# Patient Record
Sex: Male | Born: 1985 | Race: White | Hispanic: No | Marital: Married | Smoking: Never smoker
Health system: Southern US, Community
[De-identification: ages and names within clinical notes are randomized; demographics above are authoritative.]

## PROBLEM LIST (undated history)

## (undated) DIAGNOSIS — L509 Urticaria, unspecified: Secondary | ICD-10-CM

## (undated) DIAGNOSIS — B2790 Infectious mononucleosis, unspecified without complication: Secondary | ICD-10-CM

## (undated) DIAGNOSIS — R0600 Dyspnea, unspecified: Secondary | ICD-10-CM

## (undated) DIAGNOSIS — R5382 Chronic fatigue, unspecified: Secondary | ICD-10-CM

## (undated) DIAGNOSIS — K5790 Diverticulosis of intestine, part unspecified, without perforation or abscess without bleeding: Secondary | ICD-10-CM

## (undated) DIAGNOSIS — T783XXA Angioneurotic edema, initial encounter: Secondary | ICD-10-CM

## (undated) DIAGNOSIS — E669 Obesity, unspecified: Secondary | ICD-10-CM

## (undated) DIAGNOSIS — E66811 Obesity, class 1: Secondary | ICD-10-CM

## (undated) DIAGNOSIS — E7800 Pure hypercholesterolemia, unspecified: Secondary | ICD-10-CM

## (undated) DIAGNOSIS — L309 Dermatitis, unspecified: Secondary | ICD-10-CM

## (undated) DIAGNOSIS — K219 Gastro-esophageal reflux disease without esophagitis: Secondary | ICD-10-CM

## (undated) DIAGNOSIS — R000 Tachycardia, unspecified: Secondary | ICD-10-CM

## (undated) DIAGNOSIS — L649 Androgenic alopecia, unspecified: Secondary | ICD-10-CM

## (undated) DIAGNOSIS — E291 Testicular hypofunction: Secondary | ICD-10-CM

## (undated) DIAGNOSIS — R0609 Other forms of dyspnea: Secondary | ICD-10-CM

## (undated) DIAGNOSIS — K760 Fatty (change of) liver, not elsewhere classified: Secondary | ICD-10-CM

## (undated) HISTORY — DX: Infectious mononucleosis, unspecified without complication: B27.90

## (undated) HISTORY — DX: Pure hypercholesterolemia, unspecified: E78.00

## (undated) HISTORY — DX: Diverticulosis of intestine, part unspecified, without perforation or abscess without bleeding: K57.90

## (undated) HISTORY — DX: Dyspnea, unspecified: R06.00

## (undated) HISTORY — DX: Other forms of dyspnea: R06.09

## (undated) HISTORY — DX: Tachycardia, unspecified: R00.0

## (undated) HISTORY — DX: Gastro-esophageal reflux disease without esophagitis: K21.9

## (undated) HISTORY — DX: Chronic fatigue, unspecified: R53.82

## (undated) HISTORY — DX: Androgenic alopecia, unspecified: L64.9

## (undated) HISTORY — DX: Testicular hypofunction: E29.1

## (undated) HISTORY — DX: Fatty (change of) liver, not elsewhere classified: K76.0

## (undated) HISTORY — DX: Dermatitis, unspecified: L30.9

## (undated) HISTORY — DX: Urticaria, unspecified: L50.9

## (undated) HISTORY — DX: Angioneurotic edema, initial encounter: T78.3XXA

## (undated) HISTORY — DX: Obesity, unspecified: E66.9

## (undated) HISTORY — DX: Obesity, class 1: E66.811

---

## 2011-12-07 HISTORY — PX: WISDOM TOOTH EXTRACTION: SHX21

## 2017-04-04 DIAGNOSIS — R300 Dysuria: Secondary | ICD-10-CM | POA: Diagnosis not present

## 2017-04-04 DIAGNOSIS — Z7251 High risk heterosexual behavior: Secondary | ICD-10-CM | POA: Diagnosis not present

## 2017-04-04 DIAGNOSIS — Z209 Contact with and (suspected) exposure to unspecified communicable disease: Secondary | ICD-10-CM | POA: Diagnosis not present

## 2017-05-23 DIAGNOSIS — M6281 Muscle weakness (generalized): Secondary | ICD-10-CM | POA: Diagnosis not present

## 2017-05-23 DIAGNOSIS — R5383 Other fatigue: Secondary | ICD-10-CM | POA: Diagnosis not present

## 2017-06-01 DIAGNOSIS — B354 Tinea corporis: Secondary | ICD-10-CM | POA: Diagnosis not present

## 2017-06-01 DIAGNOSIS — B359 Dermatophytosis, unspecified: Secondary | ICD-10-CM | POA: Diagnosis not present

## 2017-06-01 DIAGNOSIS — L239 Allergic contact dermatitis, unspecified cause: Secondary | ICD-10-CM | POA: Diagnosis not present

## 2017-06-15 DIAGNOSIS — L42 Pityriasis rosea: Secondary | ICD-10-CM | POA: Diagnosis not present

## 2017-07-15 DIAGNOSIS — R0609 Other forms of dyspnea: Secondary | ICD-10-CM | POA: Diagnosis not present

## 2017-07-15 DIAGNOSIS — B369 Superficial mycosis, unspecified: Secondary | ICD-10-CM | POA: Diagnosis not present

## 2017-07-15 DIAGNOSIS — R05 Cough: Secondary | ICD-10-CM | POA: Diagnosis not present

## 2017-07-15 DIAGNOSIS — R000 Tachycardia, unspecified: Secondary | ICD-10-CM | POA: Diagnosis not present

## 2017-08-16 DIAGNOSIS — R000 Tachycardia, unspecified: Secondary | ICD-10-CM | POA: Diagnosis not present

## 2017-12-09 DIAGNOSIS — J069 Acute upper respiratory infection, unspecified: Secondary | ICD-10-CM | POA: Diagnosis not present

## 2017-12-09 DIAGNOSIS — L309 Dermatitis, unspecified: Secondary | ICD-10-CM | POA: Diagnosis not present

## 2018-08-17 ENCOUNTER — Encounter: Payer: Self-pay | Admitting: Allergy & Immunology

## 2018-08-17 ENCOUNTER — Telehealth: Payer: Self-pay

## 2018-08-17 ENCOUNTER — Ambulatory Visit (INDEPENDENT_AMBULATORY_CARE_PROVIDER_SITE_OTHER): Payer: BLUE CROSS/BLUE SHIELD | Admitting: Allergy & Immunology

## 2018-08-17 VITALS — BP 120/80 | HR 83 | Temp 98.2°F | Resp 18 | Ht 70.0 in | Wt 231.8 lb

## 2018-08-17 DIAGNOSIS — R000 Tachycardia, unspecified: Secondary | ICD-10-CM

## 2018-08-17 DIAGNOSIS — R0602 Shortness of breath: Secondary | ICD-10-CM

## 2018-08-17 DIAGNOSIS — T7840XD Allergy, unspecified, subsequent encounter: Secondary | ICD-10-CM | POA: Diagnosis not present

## 2018-08-17 MED ORDER — MONTELUKAST SODIUM 10 MG PO TABS: 10.0000 mg | ORAL_TABLET | Freq: Every day | ORAL | 5 refills | Status: DC

## 2018-08-17 NOTE — Patient Instructions (Addendum)
1. Tachycardia - We will refer you Cardiology for evaluation of your tachycardia. - They should call you next week to schedule this.  - Try to find out who your previous cardiologist was.  2. Allergic reaction - We will get some blood to work to rule out mast cell activity. - We are also going to get some blood work to rule out carcinoid syndrome as well as pheochromocytomas. - These are unlikely, but can present with tachycardia and rashes.  - We will also look at alpha-gal syndrome, which is a sensitivity to red meats. - We can do skin testing to the most common foods at the next visit (we cannot do testing on the skin today since you had a reaction so recently).  - In the interim, start a daily antihistamine daily (cetirizine is probably the cheapest and most effective) and Singulair (montelukast) 10mg  daily.   3. SOB (shortness of breath) - I do not want to do a large workup at this point for the shortness of breath. - Once you find out who you saw in [place], we will get their records and see what they did so that we do not repeat what has already been done. - Use the inhaler that we gave you today until the next visit to see if it helps with the shortness of breath: Breo one puff once daily - Breo contains a long acting albuterol to keep your lungs open combined with a long acting steroid to help with inflammation within the lungs.   4. Return in about 4 weeks (around 09/14/2018).   Please inform us of any Emergency Department visits, hospitalizations, or changes in symptoms. Call us before going to the ED for breathing or allergy symptoms since we might be able to fit you in for a sick visit. Feel free to contact us anytime with any questions, problems, or concerns.  It was a pleasure to meet you today!  Websites that have reliable patient information: 1. American Academy of Asthma, Allergy, and Immunology: www.aaaai.org 2. Food Allergy Research and Education (FARE):  foodallergy.org 3. Mothers of Asthmatics: http://www.asthmacommunitynetwork.org 4. American College of Allergy, Asthma, and Immunology: MissingWeapons.cawww.acaai.org   Make sure you are registered to vote! If you have moved or changed any of your contact information, you will need to get this updated before voting!

## 2018-08-17 NOTE — Progress Notes (Signed)
NEW PATIENT  Date of Service/Encounter:  08/17/18  Referring provider: Patient, No Pcp Per   Assessment:   Tachycardia  Allergic reaction  SOB (shortness of breath) - unclear whether this is asthma or not   Peter Blankenship is a delightful 32 y.o. male presenting for an evaluation of a constellation of symptoms, including flushing, shortness of breath, tachycardia, and gastrointestinal distress. All of these symptoms do not necessarily occur together, but these certainly bring up the possibility of pheochromocytoma or carcinoid syndrome. We will get some labs to rule these out. We will also get some labs to rule out alpha gal sensitivity, as this can present in rather bizarre ways. We will also get him referred to a local cardiologist so that this tachycardia can we worked up. We are going to empirically start a combined ICS/LABA to see if this would help with the shortness of breath.    Plan/Recommendations:   1. Tachycardia - We will refer you Cardiology for evaluation of your tachycardia. - They should call you next week to schedule this.  - Try to find out who your previous cardiologist was.  2. Allergic reaction - We will get some blood to work to rule out mast cell activity. - We are also going to get some blood work to rule out carcinoid syndrome as well as pheochromocytomas. - These are unlikely, but can present with tachycardia and rashes.  - We will also look at alpha-gal syndrome, which is a sensitivity to red meats. - We can do skin testing to the most common foods at the next visit (we cannot do testing on the skin today since you had a reaction so recently).  - In the interim, start a daily antihistamine daily (cetirizine is probably the cheapest and most effective) and Singulair (montelukast) 10mg  daily.   3. SOB (shortness of breath) - I do not want to do a large workup at this point for the shortness of breath. - Once you find out who you saw in [place], we will get  their records and see what they did so that we do not repeat what has already been done. - Use the inhaler that we gave you today until the next visit to see if it helps with the shortness of breath: Breo one puff once daily - Breo contains a long acting albuterol to keep your lungs open combined with a long acting steroid to help with inflammation within the lungs.   4. Return in about 4 weeks (around 09/14/2018).   Subjective:   Peter Blankenship is a 32 y.o. male presenting today for evaluation of  Chief Complaint  Patient presents with  . Establish Care  . Allergic Reaction  . Cough    3+ years duration  . Rash    Peter Blankenship has a history of the following: Patient Active Problem List   Diagnosis Date Noted  . Tachycardia 08/19/2018  . SOB (shortness of breath) 08/19/2018    History obtained from: chart review and patient.  Peter Blankenship was referred by Patient, No Pcp Per. He recently moved to [place] a few months ago.   Peter Blankenship is a 32 y.o. male presenting for an evaluation of a constellation of symptoms, including flushing, tachycardia, and shortness of breath.  The first episode occurred when he was around 32 years of age. He was hospitalized for mono for one week for a severe case. He was put on steroids and had full body rash to the point that his entire body  was red. This was the first time that he started having the breathing problems. He did have some problems with breathing at that point, but they were manageable. He did lose his sense of smell at that time nearly completely for five years. He denies concurrent congestion. He was on a nasal steroid that he thinks caused it.   Three years ago (December 2016), he was on a cruise and was red from head to toe. He is unsure of what new things he ate during that time. He went to see Urgent Care and was told that he had "red man syndrome". He was told to go to ED emergently but he declined due to cost. He treated with Benadryl with  improvement in his symptoms. He did have trouble breathing for one hour during one the days, but it was not severe. He does not think that he had heart rate issues. He was not tracking things in the same detail as he is now. He felt sick at the time and took amoxicillin (had in in [place] as a prophylaxis) and then had the reaction. He has since been worked up for penicillin allergies and this was all negative. He thinks that it was blood testing but the details were hazy.   He does have intermittent facial flushing, but otherwise the rash itself has been absent until this past Sunday. He does report that he has a heart rate in the 120s typically. He has seen a cardiologist and nothing has come back as abnormal. He is now looking for another cardiologist. He was looking into seeing Dr. Donato SchultzMark [facility]ains at [facility] but needs a referral.   He had a reaction this past Sunday. He was drinking mimosas at the time. He does have some pictures from the episode that clearly look like urticaria. They resovled over the course of less than one day with Benadryl. Peter Blankenship tolerates all of the major food allergens without adverse event.   Asthma/Respiratory Symptom History: He did have coughing for three years. He went to see Pulmonology but was told that he did not have asthma at that time. This was when he was living in [place] around 3.5 to 4 years ago. He did have some testing performed which showed no asthma. He did have full PFTs performed. He did have several tries to get through any test because coughing fits would ensue. He lives "short of breath". He did have an inhaler briefly, but this did not do anything. He is unsure what inhaler it was. Now he has a strong reaction to adverse smells.   Otherwise, there is no history of other atopic diseases, including drug allergies or urticaria. There is no significant infectious history.  Vaccinations are up to date.    Past Medical History: Patient Active Problem List    Diagnosis Date Noted  . Tachycardia 08/19/2018  . SOB (shortness of breath) 08/19/2018    Medication List:  Allergies as of 08/17/2018   Not on File     Medication List        Accurate as of 08/17/18 11:59 PM. Always use your most recent med list.          montelukast 10 MG tablet Commonly known as:  SINGULAIR Take 1 tablet (10 mg total) by mouth at bedtime.       Birth History: non-contributory  Developmental History: non-contributory.   Past Surgical History: Past Surgical History:  Procedure Laterality Date  . WISDOM TOOTH EXTRACTION Bilateral 2013  Family History: History reviewed. No pertinent family history.   Social History: Jazmine lives at home with his fiancee. He lives in an apartment that has been recently renovated. There is wood on the main living areas and carpeting in the bedrooms. They have electric heating and central cooling. There are no animals inside or outside of the home. There are no dust mite coverings on the bedding. There is no tobacco exposure in the home. 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     Review of Systems: a 14-point review of systems is pertinent for what is mentioned in HPI.  Otherwise, all other systems were negative. Constitutional: negative other than that listed in the HPI Eyes: negative other than that listed in the HPI Ears, nose, mouth, throat, and face: negative other than that listed in the HPI Respiratory: negative other than that listed in the HPI Cardiovascular: negative other than that listed in the HPI Gastrointestinal: negative other than that listed in the HPI Genitourinary: negative other than that listed in the HPI Integument: negative other than that listed in the HPI Hematologic: negative other than that listed in the HPI Musculoskeletal: negative other than that listed in the HPI Neurological: negative other than that listed in the  HPI Allergy/Immunologic: negative other than that listed in the HPI    Objective:   Blood pressure 120/80, pulse 83, temperature 98.2 F (36.8 C), temperature source Oral, resp. rate 18, height 5\' 10"  (1.778 m), weight 231 lb 12.8 oz (105.1 kg), SpO2 97 %. Body mass index is 33.26 kg/m.   Physical Exam:  General: Alert, interactive, in no acute distress. Pleasant male.  Eyes: No conjunctival injection bilaterally, no discharge on the right, no discharge on the left and no Horner-Trantas dots present. PERRL bilaterally. EOMI without pain. No photophobia.  Ears: Right TM pearly gray with normal light reflex, Left TM pearly gray with normal light reflex, Right TM intact without perforation and Left TM intact without perforation.  Nose/Throat: External nose within normal limits and septum midline. Turbinates edematous with clear discharge. Posterior oropharynx erythematous with cobblestoning in the posterior oropharynx. Tonsils 2+ without exudates.  Tongue without thrush. Neck: Supple without thyromegaly. Trachea midline. Adenopathy: shoddy bilateral anterior cervical lymphadenopathy and no enlarged lymph nodes appreciated in the occipital, axillary, epitrochlear, inguinal, or popliteal regions. Lungs: Clear to auscultation without wheezing, rhonchi or rales. No increased work of breathing. CV: Normal S1/S2. No murmurs. Capillary refill <2 seconds.  Abdomen: Nondistended, nontender. No guarding or rebound tenderness. Bowel sounds present in all fields and hypoactive  Skin: Warm and dry, without lesions or rashes. Extremities:  No clubbing, cyanosis or edema. Neuro:   Grossly intact. No focal deficits appreciated. Responsive to questions.  Diagnostic studies: none       Malachi Bonds, MD [facility]

## 2018-08-17 NOTE — Telephone Encounter (Signed)
-----   Message from Alfonse SpruceJoel Louis Gallagher, MD sent at 08/17/2018  9:28 AM EDT ----- Cardiology referral placed for [facility] ([facility])

## 2018-08-19 ENCOUNTER — Encounter: Payer: Self-pay | Admitting: Allergy & Immunology

## 2018-08-19 DIAGNOSIS — R000 Tachycardia, unspecified: Secondary | ICD-10-CM | POA: Insufficient documentation

## 2018-08-19 DIAGNOSIS — R0602 Shortness of breath: Secondary | ICD-10-CM | POA: Insufficient documentation

## 2018-08-22 LAB — ALPHA-GAL PANEL
Class Interpretation: 0
Class Interpretation: 0
Class Interpretation: 0
Lamb/Mutton (Ovis spp) IgE: <0.1 kU/L (ref <0.35)
Pork (Sus spp) IgE: <0.1 kU/L (ref <0.35)

## 2018-08-22 LAB — SEROTONIN SERUM: Serotonin, Serum: 101 ng/mL (ref 21–321)

## 2018-08-22 LAB — TRYPTASE: TRYPTASE: 1.4 ug/L — AB (ref 2.2–13.2)

## 2018-08-28 DIAGNOSIS — T7840XD Allergy, unspecified, subsequent encounter: Secondary | ICD-10-CM | POA: Diagnosis not present

## 2018-08-31 LAB — METANEPHRINES, URINE, 24 HOUR
Metaneph Total, Ur: 29 ug/L
Metanephrines, 24H Ur: 82 ug/24 hr (ref 45–290)
NORMETANEPHRINE 24H UR: 249 ug/(24.h) (ref 82–500)
NORMETANEPHRINE UR: 88 ug/L

## 2018-08-31 LAB — 5 HIAA, QUANTITATIVE, URINE, 24 HOUR
5 HIAA UR: 0.9 mg/L
5-HIAA,Quant.,24 Hr Urine: 2.5 mg/24 hr (ref 0.0–14.9)

## 2018-09-11 ENCOUNTER — Encounter: Payer: Self-pay | Admitting: Cardiology

## 2018-09-11 ENCOUNTER — Ambulatory Visit (INDEPENDENT_AMBULATORY_CARE_PROVIDER_SITE_OTHER): Payer: BLUE CROSS/BLUE SHIELD | Admitting: Cardiology

## 2018-09-11 VITALS — BP 126/90 | HR 77 | Ht 70.0 in | Wt 238.6 lb

## 2018-09-11 DIAGNOSIS — R000 Tachycardia, unspecified: Secondary | ICD-10-CM

## 2018-09-11 DIAGNOSIS — R0602 Shortness of breath: Secondary | ICD-10-CM | POA: Diagnosis not present

## 2018-09-11 NOTE — Progress Notes (Signed)
Cardiology Office Note:    Date:  09/11/2018   ID:  Peter Blankenship, DOB 24-Nov-1986, MRN [id number]  PCP:  Patient, No Pcp Per  Cardiologist:  No primary care provider on file.  Electrophysiologist:  None   Referring MD: Alfonse Spruce, *     History of Present Illness:    Peter Blankenship is a 32 y.o. male here for evaluation of tachycardia at the request of Dr. Dellis Anes.  Over the past 3 years or so he is complained of some shortness of breath usually with exertional activity.  Heart rate only seems to be high, if he does lay down usually reduces.  When he stands up increases once again. Noted HR increase in 01/2018 with fitbit. 110-120 through the day. Lay down 80. 180 with exercise.   SOB for several years.  Has had a pulmonary evaluation including several tests which were normal.  He did have some coughing during the test and stated that he had to do it 3 times before they could get a good result.    He also had a 24-hour heart monitor done in [place] which was resulted out as high heart rate but no adverse arrhythmias.  Reassurance was given.    He also notes that at age 61 had a very severe case of mono, this was at [facility].  He was placed on steroids and gained 70 pounds.  Eventually this was tapered off, ketogenic diet help him to lose weight but he is put back about 30 pounds.    He denies any excessive caffeine use.  Usually has about 2 cups of coffee a day.  He will mix his coffee with a protein shake from [employer].  No supplements for energy.    He stated that he did not know much about his family history but that his mother was okay and his grandfather died at age 68 of natural causes.  He has had no prior blood clots, no DVT, no prior PE.    He showed me a picture of one of his full body allergic reactions with large blotches over his skin.     In review of his office note from 08/17/2018 he was having symptoms of flushing shortness of breath tachycardia  GI distress not always together.  He was ruled out for pheochromocytoma or carcinoid.  He was given an antihistamine.  ECG performed today in clinic on 09/11/2018 shows sinus rhythm 77 with subtle J-point elevation in lead V2.  Currently denies any fevers chills nausea vomiting syncope bleeding  Past Medical History:  Diagnosis Date  . Angio-edema   . Eczema   . Urticaria     Past Surgical History:  Procedure Laterality Date  . WISDOM TOOTH EXTRACTION Bilateral 2013    Current Medications: Current Meds  Medication Sig  . montelukast (SINGULAIR) 10 MG tablet Take 1 tablet (10 mg total) by mouth at bedtime.     Allergies:   Patient has no allergy information on record.   Social History   Socioeconomic History  . Marital status: Single    Spouse name: Not on file  . Number of children: Not on file  . Years of education: Not on file  . Highest education level: Not on file  Occupational History  . Not on file  Social Needs  . Financial resource strain: Not on file  . Food insecurity:    Worry: Not on file    Inability: Not on file  . Transportation needs:  Medical: Not on file    Non-medical: Not on file  Tobacco Use  . Smoking status: Never Smoker  . Smokeless tobacco: Never Used  Substance and Sexual Activity  . Alcohol use: Yes    Comment: occasional   . Drug use: Yes    Types: Marijuana  . Sexual activity: Not on file  Lifestyle  . Physical activity:    Days per week: Not on file    Minutes per session: Not on file  . Stress: Not on file  Relationships  . Social connections:    Talks on phone: Not on file    Gets together: Not on file    Attends religious service: Not on file    Active member of club or organization: Not on file    Attends meetings of clubs or organizations: Not on file    Relationship status: Not on file  Other Topics Concern  . Not on file  Social History Narrative  . Not on file     Family History: As above in HPI  ROS:     Please see the history of present illness.     All other systems reviewed and are negative.  EKGs/Labs/Other Studies Reviewed:    The following studies were reviewed today: Prior office notes, lab work, EKG  EKG:  EKG is ordered today as above in HPI  Recent Labs: No results found for requested labs within last 8760 hours.  Recent Lipid Panel No results found for: CHOL, TRIG, HDL, CHOLHDL, VLDL, LDLCALC, LDLDIRECT  Physical Exam:    VS:  BP 126/90   Pulse 77   Ht 5\' 10"  (1.778 m)   Wt 238 lb 9.6 oz (108.2 kg)   SpO2 95%   BMI 34.24 kg/m     Wt Readings from Last 3 Encounters:  09/11/18 238 lb 9.6 oz (108.2 kg)  08/17/18 231 lb 12.8 oz (105.1 kg)     GEN:  Well nourished, well developed in no acute distress HEENT: Normal NECK: No JVD; No carotid bruits LYMPHATICS: No lymphadenopathy CARDIAC: RRR, no murmurs, rubs, gallops RESPIRATORY:  Clear to auscultation without rales, wheezing or rhonchi  ABDOMEN: Soft, non-tender, non-distended MUSCULOSKELETAL:  No edema; No deformity  SKIN: Warm and dry, mild redness seen at nasolabial folds NEUROLOGIC:  Alert and oriented x 3 PSYCHIATRIC:  Normal affect   ASSESSMENT:    1. Tachycardia   2. SOB (shortness of breath)    PLAN:    In order of problems listed above:  Tachycardia/shortness of breath/cough/allergic reaction -We will check an echocardiogram to ensure proper structure and function of his heart.  He has already had a Holter monitor which was reassuring other than higher heart rate than usual.  No adverse arrhythmias were detected.  Continue to conservatively decrease his caffeine use possible.  Continue with exercise.  His cough is being evaluated with allergy.  Prior pulmonary evaluation was unremarkable.  Blood work was unrevealing, no evidence of pheochromocytoma or carcinoid.    Medication Adjustments/Labs and Tests Ordered: Current medicines are reviewed at length with the patient today.  Concerns regarding  medicines are outlined above.  Orders Placed This Encounter  Procedures  . EKG 12-Lead  . ECHOCARDIOGRAM COMPLETE   No orders of the defined types were placed in this encounter.   Patient Instructions  Medication Instructions:  The current medical regimen is effective;  continue present plan and medications.  Testing/Procedures: Your physician has requested that you have an echocardiogram. Echocardiography is a painless  test that uses sound waves to create images of your heart. It provides your doctor with information about the size and shape of your heart and how well your heart's chambers and valves are working. This procedure takes approximately one hour. There are no restrictions for this procedure.  Follow-Up: Follow up to be determined after the above testing.  Thank you for choosing [facility]!!         Signed, Donato Schultz, MD  09/11/2018 9:36 AM    [facility]

## 2018-09-11 NOTE — Patient Instructions (Signed)
Medication Instructions:  The current medical regimen is effective;  continue present plan and medications.  Testing/Procedures: Your physician has requested that you have an echocardiogram. Echocardiography is a painless test that uses sound waves to create images of your heart. It provides your doctor with information about the size and shape of your heart and how well your heart's chambers and valves are working. This procedure takes approximately one hour. There are no restrictions for this procedure.  Follow-Up: Follow up to be determined after the above testing.  Thank you for choosing [facility]!!

## 2018-09-13 ENCOUNTER — Ambulatory Visit (HOSPITAL_COMMUNITY): Payer: BLUE CROSS/BLUE SHIELD | Attending: Cardiology

## 2018-09-13 ENCOUNTER — Other Ambulatory Visit: Payer: Self-pay

## 2018-09-13 DIAGNOSIS — R000 Tachycardia, unspecified: Secondary | ICD-10-CM | POA: Diagnosis not present

## 2018-09-13 DIAGNOSIS — R0602 Shortness of breath: Secondary | ICD-10-CM | POA: Diagnosis not present

## 2018-09-13 HISTORY — PX: TRANSTHORACIC ECHOCARDIOGRAM: SHX275

## 2018-09-21 ENCOUNTER — Encounter: Payer: Self-pay | Admitting: Allergy & Immunology

## 2018-09-21 ENCOUNTER — Ambulatory Visit: Payer: Self-pay | Admitting: Allergy & Immunology

## 2018-09-21 ENCOUNTER — Ambulatory Visit (INDEPENDENT_AMBULATORY_CARE_PROVIDER_SITE_OTHER): Payer: BLUE CROSS/BLUE SHIELD | Admitting: Allergy & Immunology

## 2018-09-21 VITALS — BP 118/84 | HR 124 | Resp 16

## 2018-09-21 DIAGNOSIS — R000 Tachycardia, unspecified: Secondary | ICD-10-CM

## 2018-09-21 DIAGNOSIS — T7840XD Allergy, unspecified, subsequent encounter: Secondary | ICD-10-CM | POA: Diagnosis not present

## 2018-09-21 DIAGNOSIS — R0602 Shortness of breath: Secondary | ICD-10-CM | POA: Diagnosis not present

## 2018-09-21 NOTE — Patient Instructions (Addendum)
1. Tachycardia - Continue to follow up with your Cardiologist.  2. Allergic reaction - Continue with the daily antihistamine such as cetirizine to prevent future reactions.  - We have ruled out some serious causes of anaphylaxis.  - We did do testing to look for food allergies, and these were all negative. - We tested for wheat, milk, casein, barley, oat, rye, hops, rice, orange, and pineapple. - There is a the low positive predictive value of food allergy testing and hence the high possibility of false positives. - In contrast, food allergy testing has a high negative predictive value, therefore if testing is negative we can be relatively assured that they are indeed negative.  - We could consider the use of Xolair (anti-IgE) injections, but since these happen only once per year, this seems somewhat aggressive. - I would continue to take note of any triggering reactions. - I will talk to my colleagues about your case to see if there are any other ideas. - In the meantime, we will send in a prescription for AuviQ (epinephrine). - They should call you in 1-2 days to confirm your shipping address.   3. SOB (shortness of breath) - I will wait for your outside records before we do anything else. - Continue to hold the Breo since this has not helped at all.   4. Return in about 6 months (around 03/23/2019).   Please inform us of any Emergency Department visits, hospitalizations, or changes in symptoms. Call us before going to the ED for breathing or allergy symptoms since we might be able to fit you in for a sick visit. Feel free to contact us anytime with any questions, problems, or concerns.  It was a pleasure to see you again today!  Websites that have reliable patient information: 1. American Academy of Asthma, Allergy, and Immunology: www.aaaai.org 2. Food Allergy Research and Education (FARE): foodallergy.org 3. Mothers of Asthmatics: http://www.asthmacommunitynetwork.org 4. American  College of Allergy, Asthma, and Immunology: MissingWeapons.ca   Make sure you are registered to vote! If you have moved or changed any of your contact information, you will need to get this updated before voting!

## 2018-09-21 NOTE — Progress Notes (Signed)
FOLLOW UP  Date of Service/Encounter:  09/21/18   Assessment:   Allergic reaction  Tachycardia - followed by Cardiology (Dr. Donato Schultz)  Shortness of breath - no improvement with [name]   [name] returns for a follow up appointment.The labs obtained at the last visit were unfortunately unrevealing. While we did rule out some serious causes of his reactions, we are no closer to figuring out what is going on. We also did skin testing to selected foods today based on the notable improvement in his symptoms when he is on a ketogenic diet. This was all negative, pointing against IgE mediated reactions. I recommended that he continue to take notes of triggers of his reactions, which occur every six months or so. We did discuss the use of Xolair as a stabilizer to prevent future reactions (this has been used successfully in many cases of idiopathic anaphylaxis). However since these occur so rarely, this would seem to be difficult to justify its cost. That patient is in agreement with this. We are going to continue with daily antihistamines a means of trying to tamp down future reactions.   Plan/Recommendations:   1. Tachycardia - Continue to follow up with your Cardiologist.  2. Allergic reaction - Continue with the daily antihistamine such as cetirizine to prevent future reactions.  - We have ruled out some serious causes of anaphylaxis.  - We did do testing to look for food allergies, and these were all negative. - We tested for wheat, milk, casein, barley, oat, rye, hops, rice, orange, and pineapple. - There is a the low positive predictive value of food allergy testing and hence the high possibility of false positives. - In contrast, food allergy testing has a high negative predictive value, therefore if testing is negative we can be relatively assured that they are indeed negative.  - We could consider the use of Xolair (anti-IgE) injections, but since these happen only once per year,  this seems somewhat aggressive. - I would continue to take note of any triggering reactions. - I will talk to my colleagues about your case to see if there are any other ideas. - In the meantime, we will send in a prescription for AuviQ (epinephrine). - They should call you in 1-2 days to confirm your shipping address.   3. SOB (shortness of breath) - I will wait for your outside records before we do anything else. - Continue to hold the Breo since this has not helped at all.   4. Return in about 6 months (around 03/23/2019).  Subjective:   Peter [facility] is a 32 y.o. male presenting today for follow up of  Chief Complaint  Patient presents with  . Follow-up    Peter [facility] has a history of the following: Patient Active Problem List   Diagnosis Date Noted  . Tachycardia 08/19/2018  . SOB (shortness of breath) 08/19/2018    History obtained from: chart review and patient.  Peter [facility] Primary Care Provider is Patient, No Pcp Per.     Peter [facility] is a 32 y.o. male presenting for a follow up visit. He was last seen in September 2019 as a New Patient. At that time, he was endorsing a constellation of symptoms including tachycardia as well as bizarre allergic reactions. We did get some blood work to rule out carcinoid syndrome as well as pheochromocytoma. He was endorsing some SOB and we started Breo empirically.   Since the last visit, he has mostly done well. He did have  an echocardiogram that was normal. Apparently his heart rate was ironically normal when he was meeting with the cardiologist.   Peter [facility] has not had any improvement with the shortness of breath. The Breo did not help at all, unfortunately. He has not needed any prednisone or ED visits. He has had no other reactions at all. He is interested in food allergy testing today to look for allergies to particular foods.   Otherwise, there have been no changes to his past medical history, surgical history, family history, or social  history.    Review of Systems: a 14-point review of systems is pertinent for what is mentioned in HPI.  Otherwise, all other systems were negative.  Constitutional: negative other than that listed in the HPI Eyes: negative other than that listed in the HPI Ears, nose, mouth, throat, and face: negative other than that listed in the HPI Respiratory: negative other than that listed in the HPI Cardiovascular: negative other than that listed in the HPI Gastrointestinal: negative other than that listed in the HPI Genitourinary: negative other than that listed in the HPI Integument: negative other than that listed in the HPI Hematologic: negative other than that listed in the HPI Musculoskeletal: negative other than that listed in the HPI Neurological: negative other than that listed in the HPI Allergy/Immunologic: negative other than that listed in the HPI    Objective:   Blood pressure 118/84, pulse (!) 124, resp. rate 16. There is no height or weight on file to calculate BMI.   Physical Exam:  General: Alert, interactive, in no acute distress. Pleasant.  Eyes: No conjunctival injection bilaterally, no discharge on the right, no discharge on the left and no Horner-Trantas dots present. PERRL bilaterally. EOMI without pain. No photophobia.  Ears: Right TM pearly gray with normal light reflex, Left TM pearly gray with normal light reflex, Right TM intact without perforation and Left TM intact without perforation.  Nose/Throat: External nose within normal limits and septum midline. Turbinates edematous and pale with clear discharge. Posterior oropharynx erythematous without cobblestoning in the posterior oropharynx. Tonsils 2+ without exudates.  Tongue without thrush. Lungs: Clear to auscultation without wheezing, rhonchi or rales. No increased work of breathing. CV: Normal S1/S2. No murmurs. Capillary refill <2 seconds.  Skin: Warm and dry, without lesions or rashes. Neuro:   Grossly  intact. No focal deficits appreciated. Responsive to questions.  Diagnostic studies: none    Allergy Studies:   Food Adult Perc - 09/21/18 1600    Time Antigen Placed  1610    Allergen Manufacturer  Waynette Buttery    Location  Back    Number of allergen test  12     Control-buffer 50% Glycerol  Negative    Control-Histamine 1 mg/ml  2+    3. Wheat  Negative    5. Milk, cow  Negative    7. Casein  Negative    30. Barley  Negative    31. Oat   Negative    32. Rye   Negative    33. Hops  Negative    34. Rice  Negative    56. Orange   Negative    63. Pineapple  Negative        Allergy testing results were read and interpreted by myself, documented by clinical staff.      Malachi Bonds, MD  [facility]

## 2018-09-23 ENCOUNTER — Encounter: Payer: Self-pay | Admitting: Allergy & Immunology

## 2018-10-02 ENCOUNTER — Telehealth: Payer: Self-pay | Admitting: Allergy & Immunology

## 2018-10-02 DIAGNOSIS — R000 Tachycardia, unspecified: Secondary | ICD-10-CM

## 2018-10-02 NOTE — Telephone Encounter (Signed)
Patient would like a referral to endocrinology Wants to check his hormones and auto immune to see what going on  Please call with any information or questions

## 2018-10-02 NOTE — Telephone Encounter (Signed)
Dr. Gallagher will you please advise? 

## 2018-10-03 NOTE — Telephone Encounter (Signed)
We can refer him to Endocrinology, but I am unsure what diagnosis code to use. Maybe we can link it to the tachycardia code.   Malachi Bonds, MD [facility]

## 2018-10-04 NOTE — Telephone Encounter (Signed)
Looks like the referral is under review with their office.

## 2018-10-16 ENCOUNTER — Telehealth: Payer: Self-pay | Admitting: Cardiology

## 2018-10-16 NOTE — Telephone Encounter (Signed)
° ° ° ° °  1) What is your heart rate? 115, 120  2) Do you have a log of your heart rate readings (document readings)? Patient states daily HR is 120  3) Do you have any other symptoms? no   Patient wants to know what are the next steps with his care, even with a normal echo.

## 2018-10-16 NOTE — Telephone Encounter (Signed)
Spoke with patient who continues to have tachycardia with HR from 100 to 120 bpm.  He became aware of this when he got a fitbit in February this year. He is not taking any medications now, not using protein shakes and doesn't feel like there is a dietary source for his rapid HR.  Allergist has not been able to find anything abnormal.  Normal echo.  He is not able to really exercise for very long d/t increased HR that leads to SOB.  He is asking what else he should do or what could be causing the tachycardia.  He has not had a recent CBC to his knowledge.  Advised I will forward this to Dr Anne Fu for review and any orders.  He is aware I will call back to f/u.

## 2018-10-17 NOTE — Telephone Encounter (Signed)
Despite tachycardia, echocardiogram was reassuring showing normal pump function. One thing that we could do empirically is place him on Cardizem CD 120 mg once a day, low-dose calcium channel blocker to help blunt the tachycardia.  We can do this and see if this makes him feel better. Donato SchultzMark Skains, MD

## 2018-10-19 MED ORDER — DILTIAZEM HCL ER COATED BEADS 120 MG PO CP24: 120.0000 mg | ORAL_CAPSULE | Freq: Every day | ORAL | 11 refills | Status: DC

## 2018-10-19 NOTE — Telephone Encounter (Signed)
Spoke with patient and advised of Dr Anne FuSkains recommendation/order to start Cardizem CD 120 mg daily to help with HR.  Pt is agreeable, RX sent into pharmacy and he will c/b to f/u if any issues or concerns.

## 2018-11-15 ENCOUNTER — Encounter (INDEPENDENT_AMBULATORY_CARE_PROVIDER_SITE_OTHER): Payer: Self-pay

## 2018-11-15 ENCOUNTER — Encounter: Payer: Self-pay | Admitting: Endocrinology

## 2018-11-15 ENCOUNTER — Ambulatory Visit (INDEPENDENT_AMBULATORY_CARE_PROVIDER_SITE_OTHER): Payer: BLUE CROSS/BLUE SHIELD | Admitting: Endocrinology

## 2018-11-15 VITALS — BP 142/92 | HR 107 | Ht 70.0 in | Wt 241.2 lb

## 2018-11-15 DIAGNOSIS — R232 Flushing: Secondary | ICD-10-CM | POA: Diagnosis not present

## 2018-11-15 DIAGNOSIS — R000 Tachycardia, unspecified: Secondary | ICD-10-CM

## 2018-11-15 DIAGNOSIS — R7989 Other specified abnormal findings of blood chemistry: Secondary | ICD-10-CM | POA: Insufficient documentation

## 2018-11-15 LAB — TSH: TSH: 3.56 u[IU]/mL (ref 0.35–4.50)

## 2018-11-15 LAB — T4, FREE: Free T4: 0.8 ng/dL (ref 0.60–1.60)

## 2018-11-15 NOTE — Progress Notes (Signed)
Subjective:    Patient ID: Peter [facility], male    DOB: 19-Jul-1986, 32 y.o.   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  He has no h/o HTN.  He has no h/o adrenal disease, diabetes, migraine headache, MTC, alcohol withdrawal, neurofibromas, hemangiomas, brain aneurysm, cocaine use, or thyroid disease.    Pt also reports h/o hypogonadism.  Pt reports he had puberty at the normal age.  He has no biological children, but wife has had several miscarriages.  He says he has never taken illicit androgens. He does not take antiandrogens or opioids.  He denies any h/o infertility, XRT, or genital infection.  He has never had surgery, or a serious injury to the head or genital area. He has no h/o sleep apnea or DVT.   He does not consume alcohol excessively.  He took testosterone rx x 8 mos, in 2018.  He has 1 year of daily moderate flushing of the face.  He sometimes gets assoc generalized erythema.   Past Medical History:  Diagnosis Date  . Angio-edema   . Eczema   . Urticaria     Past Surgical History:  Procedure Laterality Date  . WISDOM TOOTH EXTRACTION Bilateral 2013    Social History   Socioeconomic History  . Marital status: Single    Spouse name: Not on file  . Number of children: Not on file  . Years of education: Not on file  . Highest education level: Not on file  Occupational History  . Not on file  Social Needs  . Financial resource strain: Not on file  . Food insecurity:    Worry: Not on file    Inability: Not on file  . Transportation needs:    Medical: Not on file    Non-medical: Not on file  Tobacco Use  . Smoking status: Never Smoker  . Smokeless tobacco: Never Used  Substance and Sexual Activity  . Alcohol use: Yes    Comment: occasional   . Drug use: Yes    Types: Marijuana  . Sexual activity: Not on file  Lifestyle  . Physical activity:    Days per week: Not on file    Minutes per session: Not on file  . Stress:  Not on file  Relationships  . Social connections:    Talks on phone: Not on file    Gets together: Not on file    Attends religious service: Not on file    Active member of club or organization: Not on file    Attends meetings of clubs or organizations: Not on file    Relationship status: Not on file  . Intimate partner violence:    Fear of current or ex partner: Not on file    Emotionally abused: Not on file    Physically abused: Not on file    Forced sexual activity: Not on file  Other Topics Concern  . Not on file  Social History Narrative  . Not on file    Current Outpatient Medications on File Prior to Visit  Medication Sig Dispense Refill  . diltiazem (CARDIZEM CD) 120 MG 24 hr capsule Take 1 capsule (120 mg total) by mouth daily. 30 capsule 11   No current facility-administered medications on file prior to visit.     No Known Allergies  No family history on file.  BP (!) 142/92 (BP Location: Left Arm, Patient Position: Sitting, Cuff Size: Normal)   Pulse (!) 107  Ht 5\' 10"  (1.778 m)   Wt 241 lb 3.2 oz (109.4 kg)   SpO2 98%   BMI 34.61 kg/m   Review of Systems denies headache, pallor, n/v, syncope, diarrhea, chest pain, anxiety, visual loss, fever, arthralgias, rhinorrhea, easy bruising, and excessive diaphoresis.  He has weight gain (30 lbs x 1 year).  He has intermitt palpitations and doe.      Objective:   Physical Exam VS: see vs page GEN: no distress HEAD: head: no deformity eyes: no periorbital swelling, no proptosis external nose and ears are normal mouth: no lesion seen.  No neurofibromata.   NECK: supple, thyroid is not enlarged CHEST WALL: no deformity LUNGS: clear to auscultation CV: reg rate and rhythm, no murmur ABD: abdomen is soft, nontender.  no hepatosplenomegaly.  not distended.  no hernia. GENITALIA: Normal male testicles, scrotum, and penis MUSCULOSKELETAL: muscle bulk and strength are grossly normal.  no obvious joint swelling.  gait  is normal and steady EXTEMITIES: no deformity.  no edema PULSES: no carotid bruit NEURO:  cn 2-12 grossly intact.   readily moves all 4's.  sensation is intact to touch on all 4's SKIN:  Normal texture and temperature.  No rash or suspicious lesion is visible.   NODES:  None palpable at the neck PSYCH: alert, well-oriented.  Does not appear anxious nor depressed.    24-HR urine catechols are normal  I have reviewed outside records, and summarized: Pt was noted to have HTN, and referred here.  He reported sxs of flushing.        Assessment & Plan:  HTN, new to me Flushing: pheochromocytoma should be excluded with greater certainty  Patient Instructions  blood tests, and another 24-HR urine test, are requested for you today.  We'll let you know about the results.

## 2018-11-15 NOTE — Patient Instructions (Addendum)
blood tests, and another 24-HR urine test, are requested for you today.  We'll let you know about the results.

## 2018-11-16 LAB — LUTEINIZING HORMONE: LH: 2.82 m[IU]/mL (ref 1.50–9.30)

## 2018-11-17 LAB — TESTOSTERONE,FREE AND TOTAL
Testosterone, Free: 13 pg/mL (ref 8.7–25.1)
Testosterone: 434 ng/dL (ref 264–916)

## 2018-11-21 ENCOUNTER — Telehealth: Payer: Self-pay | Admitting: Endocrinology

## 2018-11-21 NOTE — Telephone Encounter (Signed)
Called pt and made him aware. Education provided re: condition. Verbalized acceptance and understanding.

## 2018-11-21 NOTE — Telephone Encounter (Signed)
The tests were slightly abnormal.   In order to diagnose the condition called "pheochromocytoma," the results need to be several times normal.

## 2018-11-21 NOTE — Telephone Encounter (Signed)
Please advise 

## 2018-11-21 NOTE — Telephone Encounter (Signed)
Patient states they are calling in regards to a statement "border line diagnoses" and would like to know what diagnoses that is. Please Advise, thanks

## 2018-11-22 LAB — ESTRADIOL, FREE
Estradiol, Free: 0.33 pg/mL
Estradiol: 19 pg/mL

## 2018-11-22 LAB — PROLACTIN: Prolactin: 12.8 ng/mL (ref 2.0–18.0)

## 2018-11-22 LAB — CATECHOLAMINES, FRACTIONATED, PLASMA
Catecholamines, Total: 899 pg/mL
Epinephrine: 47 pg/mL
Norepinephrine: 852 pg/mL

## 2018-11-22 LAB — CALCITONIN

## 2018-11-22 LAB — METANEPHRINES, PLASMA
Metanephrine, Free: 32 pg/mL (ref <=57)
Normetanephrine, Free: 181 pg/mL — ABNORMAL HIGH (ref <=148)
Total Metanephrines-Plasma: 213 pg/mL — ABNORMAL HIGH (ref <=205)

## 2018-11-24 ENCOUNTER — Encounter: Payer: Self-pay | Admitting: Allergy & Immunology

## 2019-01-17 ENCOUNTER — Emergency Department (HOSPITAL_COMMUNITY): Payer: BLUE CROSS/BLUE SHIELD

## 2019-01-17 ENCOUNTER — Other Ambulatory Visit: Payer: Self-pay

## 2019-01-17 ENCOUNTER — Encounter (HOSPITAL_COMMUNITY): Payer: Self-pay | Admitting: Emergency Medicine

## 2019-01-17 ENCOUNTER — Emergency Department (HOSPITAL_COMMUNITY)
Admission: EM | Admit: 2019-01-17 | Discharge: 2019-01-17 | Disposition: A | Discharge status: Home / Self Care | Payer: BLUE CROSS/BLUE SHIELD | Attending: Emergency Medicine | Admitting: Emergency Medicine

## 2019-01-17 DIAGNOSIS — R112 Nausea with vomiting, unspecified: Secondary | ICD-10-CM | POA: Insufficient documentation

## 2019-01-17 DIAGNOSIS — R1031 Right lower quadrant pain: Secondary | ICD-10-CM | POA: Insufficient documentation

## 2019-01-17 DIAGNOSIS — Z79899 Other long term (current) drug therapy: Secondary | ICD-10-CM | POA: Insufficient documentation

## 2019-01-17 DIAGNOSIS — K5730 Diverticulosis of large intestine without perforation or abscess without bleeding: Secondary | ICD-10-CM | POA: Diagnosis not present

## 2019-01-17 LAB — COMPREHENSIVE METABOLIC PANEL
ALBUMIN: 4.5 g/dL (ref 3.5–5.0)
ALT: 32 U/L (ref 0–44)
AST: 27 U/L (ref 15–41)
Alkaline Phosphatase: 60 U/L (ref 38–126)
Anion gap: 12 (ref 5–15)
BUN: 12 mg/dL (ref 6–20)
CHLORIDE: 98 mmol/L (ref 98–111)
CO2: 27 mmol/L (ref 22–32)
Calcium: 10 mg/dL (ref 8.9–10.3)
Creatinine, Ser: 1.34 mg/dL — ABNORMAL HIGH (ref 0.61–1.24)
GFR calc Af Amer: >60 mL/min (ref >60)
GFR calc non Af Amer: >60 mL/min (ref >60)
GLUCOSE: 112 mg/dL — AB (ref 70–99)
Potassium: 4.1 mmol/L (ref 3.5–5.1)
Sodium: 137 mmol/L (ref 135–145)
Total Bilirubin: 1.1 mg/dL (ref 0.3–1.2)
Total Protein: 7.8 g/dL (ref 6.5–8.1)

## 2019-01-17 LAB — URINALYSIS, ROUTINE W REFLEX MICROSCOPIC
Bilirubin Urine: NEGATIVE
Glucose, UA: NEGATIVE mg/dL
Hgb urine dipstick: NEGATIVE
Ketones, ur: NEGATIVE mg/dL
Leukocytes,Ua: NEGATIVE
Nitrite: NEGATIVE
PROTEIN: NEGATIVE mg/dL
Specific Gravity, Urine: 1.015 (ref 1.005–1.030)
pH: 6 (ref 5.0–8.0)

## 2019-01-17 LAB — LIPASE, BLOOD: Lipase: 28 U/L (ref 11–51)

## 2019-01-17 LAB — CBC
HCT: 45.1 % (ref 39.0–52.0)
Hemoglobin: 16.1 g/dL (ref 13.0–17.0)
MCH: 31.5 pg (ref 26.0–34.0)
MCHC: 35.7 g/dL (ref 30.0–36.0)
MCV: 88.3 fL (ref 80.0–100.0)
Platelets: 393 10*3/uL (ref 150–400)
RBC: 5.11 MIL/uL (ref 4.22–5.81)
RDW: 11.9 % (ref 11.5–15.5)
WBC: 9.7 10*3/uL (ref 4.0–10.5)
nRBC: 0 % (ref 0.0–0.2)

## 2019-01-17 MED ORDER — IOHEXOL 300 MG/ML  SOLN
100.0000 mL | Freq: Once | INTRAMUSCULAR | Status: AC | PRN
  Administered 2019-01-17: 100 mL via INTRAVENOUS

## 2019-01-17 MED ORDER — ONDANSETRON 4 MG PO TBDP: 4.0000 mg | ORAL_TABLET | Freq: Three times a day (TID) | ORAL | 0 refills | Status: DC | PRN

## 2019-01-17 MED ORDER — HYDROCODONE-ACETAMINOPHEN 5-325 MG PO TABS: 1.0000 | ORAL_TABLET | ORAL | 0 refills | Status: DC | PRN

## 2019-01-17 MED ORDER — SODIUM CHLORIDE 0.9 % IV BOLUS (SEPSIS)
1000.0000 mL | Freq: Once | INTRAVENOUS | Status: AC
  Administered 2019-01-17: 1000 mL via INTRAVENOUS

## 2019-01-17 MED ORDER — SODIUM CHLORIDE 0.9% FLUSH
3.0000 mL | Freq: Once | INTRAVENOUS | Status: AC
  Administered 2019-01-17: 3 mL via INTRAVENOUS

## 2019-01-17 MED ORDER — FENTANYL CITRATE (PF) 100 MCG/2ML IJ SOLN
100.0000 ug | Freq: Once | INTRAMUSCULAR | Status: AC
  Administered 2019-01-17: 100 ug via INTRAVENOUS
  Filled 2019-01-17: qty 2

## 2019-01-17 MED ORDER — ONDANSETRON HCL 4 MG/2ML IJ SOLN
4.0000 mg | Freq: Once | INTRAMUSCULAR | Status: AC
  Administered 2019-01-17: 4 mg via INTRAVENOUS
  Filled 2019-01-17: qty 2

## 2019-01-17 MED ORDER — ONDANSETRON 4 MG PO TBDP
4.0000 mg | ORAL_TABLET | Freq: Once | ORAL | Status: AC | PRN
  Administered 2019-01-17: 4 mg via ORAL
  Filled 2019-01-17: qty 1

## 2019-01-17 MED ORDER — FENTANYL CITRATE (PF) 100 MCG/2ML IJ SOLN
INTRAMUSCULAR | Status: AC
  Administered 2019-01-17: 100 ug via INTRAVENOUS
  Filled 2019-01-17: qty 2

## 2019-01-17 NOTE — ED Provider Notes (Signed)
Pt signed out by Dr. Bebe Shaggy pending CT abd/pelvis:  IMPRESSION:  1. Scattered colonic diverticula without diverticulitis. No bowel  obstruction. No abscess in the abdomen or pelvis. Appendix appears  normal.    2. No appreciable renal or ureteral calculus. No hydronephrosis.    3. Small ventral hernia containing only fat. Fat noted in each  inguinal ring.    4. Borderline hepatic steatosis.     Pt is tolerating po fluids well.  He is feeling much better.  He is stable for d/c.  Return if worse.   Jacalyn Lefevre, MD 01/17/19 [phone]

## 2019-01-17 NOTE — ED Triage Notes (Signed)
Patient with abdominal pain and emesis since last night.  He states that he is having some nausea continuously and having a gassy feeling in his stomach.  Gas X and Tums at home with no relief.

## 2019-01-17 NOTE — ED Provider Notes (Signed)
[facility] Provider Note   CSN: [id number] Arrival date & time: 01/17/19  0341     History   Chief Complaint Chief Complaint  Patient presents with  . Abdominal Pain  . Emesis   Patient gives permission to perform history and physical in front of family/friend  HPI Peter [facility] is a 33 y.o. male.  The history is provided by the patient and a significant other.  Abdominal Pain  Pain location:  Generalized Pain quality: aching   Pain radiates to:  Does not radiate Pain severity:  Moderate Onset quality:  Gradual Duration:  24 hours Timing:  Intermittent Progression:  Worsening Chronicity:  New Relieved by:  Nothing Worsened by:  Palpation Associated symptoms: cough and vomiting   Associated symptoms: no chest pain, no constipation, no diarrhea, no dysuria, no fever and no hematochezia   Emesis  Associated symptoms: abdominal pain and cough   Associated symptoms: no diarrhea and no fever   Patient reports onset of pain about 24 hours ago. This gradually worsened, and he is now having nausea and vomiting.  He vomited so severe he has a rash to his face. He has never had this pain before No abdominal surgical history Past Medical History:  Diagnosis Date  . Angio-edema   . Eczema   . Urticaria     Patient Active Problem List   Diagnosis Date Noted  . Flushing 11/15/2018  . 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 Tachycardia 08/19/2018  . SOB (shortness of breath) 08/19/2018    Past Surgical History:  Procedure Laterality Date  . WISDOM TOOTH EXTRACTION Bilateral 2013        Home Medications    Prior to Admission medications   Medication Sig Start Date End Date Taking? Authorizing Provider  diltiazem (CARDIZEM CD) 120 MG 24 hr capsule Take 1 capsule (120 mg total) by mouth daily. 10/19/18  Yes Jake BatheSkains, Mark C, MD    Family History No family history on file.  Social History Social History   Tobacco Use  . Smoking  status: Never Smoker  . Smokeless tobacco: Never Used  Substance Use Topics  . Alcohol use: Yes    Comment: occasional   . Drug use: Yes    Types: Marijuana     Allergies   Patient has no known allergies.   Review of Systems Review of Systems  Constitutional: Negative for fever.  Respiratory: Positive for cough.   Cardiovascular: Negative for chest pain.  Gastrointestinal: Positive for abdominal pain and vomiting. Negative for constipation, diarrhea and hematochezia.  Genitourinary: Negative for dysuria and testicular pain.  All other systems reviewed and are negative.    Physical Exam Updated Vital Signs BP (!) 136/102 (BP Location: Right Arm)   Pulse (!) 106   Temp 98.5 F (36.9 C) (Oral)   Resp 18   SpO2 95%   Physical Exam CONSTITUTIONAL: Well developed/well nourished HEAD: Normocephalic/atraumatic EYES: EOMI/PERRL ENMT: Mucous membranes moist NECK: supple no meningeal signs SPINE/BACK:entire spine nontender CV: S1/S2 noted, no murmurs/rubs/gallops noted LUNGS: Lungs are clear to auscultation bilaterally, no apparent distress ABDOMEN: soft, moderate RLQ tenderness, no rebound or guarding, decreased bowel sounds throughout  GU:no cva tenderness NEURO: Pt is awake/alert/appropriate, moves all extremitiesx4.  No facial droop.   EXTREMITIES: pulses normal/equal, full ROM SKIN: warm, color normal, petechiae to face PSYCH: no abnormalities of mood noted, alert and oriented to situation   ED Treatments / Results  Labs (all labs ordered are listed, but only abnormal  results are displayed) Labs Reviewed  COMPREHENSIVE METABOLIC PANEL - Abnormal; Notable for the following components:      Result Value   Glucose, Bld 112 (*)    Creatinine, Ser 1.34 (*)    All other components within normal limits  LIPASE, BLOOD  CBC  URINALYSIS, ROUTINE W REFLEX MICROSCOPIC    EKG None  Radiology No results found.  Procedures Procedures (including critical care  time)  Medications Ordered in ED Medications  sodium chloride flush (NS) 0.9 % injection 3 mL (has no administration in time range)  sodium chloride 0.9 % bolus 1,000 mL (has no administration in time range)  fentaNYL (SUBLIMAZE) injection 100 mcg (has no administration in time range)  ondansetron (ZOFRAN) injection 4 mg (has no administration in time range)  ondansetron (ZOFRAN-ODT) disintegrating tablet 4 mg (4 mg Oral Given 01/17/19 0403)     Initial Impression / Assessment and Plan / ED Course  I have reviewed the triage vital signs and the nursing notes.  Pertinent labs results that were available during my care of the patient were reviewed by me and considered in my medical decision making (see chart for details).     7:22 AM PT With increasing abdominal pain and nausea/vomiting.  He had such forceful vomiting he does have scattered petechiae to face, but no other acute findings. Plan to obtain CT imaging to evaluate for appendicitis. Signed out to dr Particia Nearinghaviland with CT imaging pending   Final Clinical Impressions(s) / ED Diagnoses   Final diagnoses:  None    ED Discharge Orders    None       Zadie RhineWickline, Adaiah Jaskot, MD 01/17/19 [id number]

## 2019-01-17 NOTE — ED Notes (Signed)
D/c reviewed with patient and partner 

## 2019-01-17 NOTE — ED Notes (Signed)
ED Provider at bedside. 

## 2019-01-17 NOTE — ED Notes (Signed)
Patient transported to CT 

## 2019-05-17 ENCOUNTER — Telehealth: Payer: Self-pay | Admitting: Cardiology

## 2019-05-17 NOTE — Telephone Encounter (Signed)
Increase diltiazem CD to 180mg  PO QD from 120. Also have him come in for DOD visit when I am in the clinic. Friday 6/19. Needs ECG Thanks Candee Furbish, MD

## 2019-05-17 NOTE — Telephone Encounter (Signed)
New Message     Pt c/o medication issue:  1. Name of Medication: Diltiazem  2. How are you currently taking this medication (dosage and times per day)? 120mg  1 capsule daily  3. Are you having a reaction (difficulty breathing--STAT)? No   4. What is your medication issue? Patient's heart rate is 110 while sitting at his desk at work and he states the medications is suppose to keep it regulated but it doesn't seem to be working and he would like a nurse to call him back.

## 2019-05-17 NOTE — Telephone Encounter (Signed)
Called patient back about his message. Patient complained about his HR being 115, having SOB that gets worse with activity, and feeling tired all the time. Patient stated his HR has been like this since last February and nothing has helped or improved his HR. Will send to Dr. Marlou Porch for advisement.

## 2019-05-18 MED ORDER — DILTIAZEM HCL ER COATED BEADS 180 MG PO CP24: 180.0000 mg | ORAL_CAPSULE | Freq: Every day | ORAL | 3 refills | Status: DC

## 2019-05-18 NOTE — Telephone Encounter (Signed)
    COVID-19 Pre-Screening Questions:  . In the past 7 to 10 days have you had a cough,  shortness of breath, headache, congestion, fever (100 or greater) body aches, chills, sore throat, or sudden loss of taste or sense of smell? SOB, but not to other symptoms . Have you been around anyone with known Covid 19. NO . Have you been around anyone who is awaiting Covid 19 test results in the past 7 to 10 days? NO . Have you been around anyone who has been exposed to Covid 19, or has mentioned symptoms of Covid 19 within the past 7 to 10 days? NO  If you have any concerns/questions about symptoms patients report during screening (either on the phone or at threshold). Contact the provider seeing the patient or DOD for further guidance.  If neither are available contact a member of the leadership team.    Called patient back about Dr. Marlou Porch recommendations. Per Dr. Marlou Porch, increase diltiazem CD to 180 mg PO QD from 120. Also have him come in for DOD visit when I am in the clinic Friday 6/19, needs EKG. Patient verbalized understanding.

## 2019-05-18 NOTE — Telephone Encounter (Signed)
Left message for patient to call back  

## 2019-05-25 ENCOUNTER — Other Ambulatory Visit: Payer: Self-pay

## 2019-05-25 ENCOUNTER — Encounter: Payer: Self-pay | Admitting: Cardiology

## 2019-05-25 ENCOUNTER — Ambulatory Visit (INDEPENDENT_AMBULATORY_CARE_PROVIDER_SITE_OTHER): Payer: BC Managed Care – PPO | Admitting: Cardiology

## 2019-05-25 VITALS — BP 120/80 | HR 68 | Ht 70.0 in | Wt 243.0 lb

## 2019-05-25 DIAGNOSIS — R000 Tachycardia, unspecified: Secondary | ICD-10-CM

## 2019-05-25 DIAGNOSIS — R0602 Shortness of breath: Secondary | ICD-10-CM

## 2019-05-25 DIAGNOSIS — R05 Cough: Secondary | ICD-10-CM | POA: Diagnosis not present

## 2019-05-25 DIAGNOSIS — R053 Chronic cough: Secondary | ICD-10-CM

## 2019-05-25 NOTE — Patient Instructions (Signed)
Medication Instructions:  The current medical regimen is effective;  continue present plan and medications.  If you need a refill on your cardiac medications before your next appointment, please call your pharmacy.   Testing/Procedures: You are being referred to Pulmonary for further evaluation of shortness of breath and cough.  Follow-Up: At [facility], you and your health needs are our priority.  As part of our continuing mission to provide you with exceptional heart care, we have created designated Provider Care Teams.  These Care Teams include your primary Cardiologist (physician) and Advanced Practice Providers (APPs -  Physician Assistants and Nurse Practitioners) who all work together to provide you with the care you need, when you need it. You will need a follow up appointment in 12 months.  Please call our office 2 months in advance to schedule this appointment.  You may see Candee Furbish, MD or one of the following Advanced Practice Providers on your designated Care Team:   Truitt Merle, NP Cecilie Kicks, NP . Kathyrn Drown, NP  Thank you for choosing [facility]!!

## 2019-05-25 NOTE — Progress Notes (Signed)
Cardiology Office Note:    Date:  05/25/2019   ID:  Peter ElizabethAndrew Vandalen, DOB 1986/04/21, MRN [id number]  PCP:  Patient, No Pcp Per  Cardiologist:  Donato SchultzMark Donnabelle Blanchard, MD  Electrophysiologist:  None   Referring MD: No ref. provider found   SOB/Cough  History of Present Illness:    Peter Blankenship is a 33 y.o. male here for follow-up of tachycardia.  Over the past 3 years or so he is complained of some shortness of breath usually with exertional activity.  Heart rate only seems to be high, if he does lay down usually reduces.  When he stands up increases once again. Noted HR increase in 01/2018 with fitbit. 110-120 through the day. Lay down 80. 180 with exercise.   SOB for several years.  Has had a pulmonary evaluation including several tests which were normal.  He did have some coughing during the test and stated that he had to do it 3 times before they could get a good result.    He also had a 24-hour heart monitor done in [place] which was resulted out as high heart rate but no adverse arrhythmias.  Reassurance was given.    He also notes that at age 33 had a very severe case of mono, this was at [facility].  He was placed on steroids and gained 70 pounds.  Eventually this was tapered off, ketogenic diet help him to lose weight but he is put back about 30 pounds.    He denies any excessive caffeine use.  Usually has about 2 cups of coffee a day.  He will mix his coffee with a protein shake from [employer].  No supplements for energy.    He stated that he did not know much about his family history but that his mother was okay and his grandfather died at age 34 of natural causes.  He has had no prior blood clots, no DVT, no prior PE.    He showed me a picture of one of his full body allergic reactions with large blotches over his skin.     In review of his office note from 08/17/2018 he was having symptoms of flushing shortness of breath tachycardia GI distress not always together.  He  was ruled out for pheochromocytoma or carcinoid.  He was given an antihistamine.  ECG performed on 09/11/2018 shows sinus rhythm 77 with subtle J-point elevation in lead V2.  Tried albuterol prior to exercise. Did not seem to help. When exercise, starts to cough and sometimes vomits. Fatigue. Past Medical History:  Diagnosis Date  . Angio-edema   . Eczema   . Urticaria     Past Surgical History:  Procedure Laterality Date  . WISDOM TOOTH EXTRACTION Bilateral 2013    Current Medications: Current Meds  Medication Sig  . diltiazem (CARDIZEM CD) 180 MG 24 hr capsule Take 1 capsule (180 mg total) by mouth daily.     Allergies:   Patient has no known allergies.   Social History   Socioeconomic History  . Marital status: Single    Spouse name: Not on file  . Number of children: Not on file  . Years of education: Not on file  . Highest education level: Not on file  Occupational History  . Not on file  Social Needs  . Financial resource strain: Not on file  . Food insecurity    Worry: Not on file    Inability: Not on file  . Transportation needs  Medical: Not on file    Non-medical: Not on file  Tobacco Use  . Smoking status: Never Smoker  . Smokeless tobacco: Never Used  Substance and Sexual Activity  . Alcohol use: Yes    Comment: occasional   . Drug use: Yes    Types: Marijuana  . Sexual activity: Not on file  Lifestyle  . Physical activity    Days per week: Not on file    Minutes per session: Not on file  . Stress: Not on file  Relationships  . Social Musicianconnections    Talks on phone: Not on file    Gets together: Not on file    Attends religious service: Not on file    Active member of club or organization: Not on file    Attends meetings of clubs or organizations: Not on file    Relationship status: Not on file  Other Topics Concern  . Not on file  Social History Narrative  . Not on file     Family History: The patient's family history is not on  file. No early CAD  ROS:   Please see the history of present illness.    No fevers chills nausea vomiting syncope bleeding all other systems reviewed and are negative.  EKGs/Labs/Other Studies Reviewed:    The following studies were reviewed today:  ECHO 09/13/18: - Left ventricle: The cavity size was normal. There was mild   concentric hypertrophy. Systolic function was normal. The   estimated ejection fraction was in the range of 55% to 60%. Wall   motion was normal; there were no regional wall motion   abnormalities. Doppler parameters are consistent with abnormal   left ventricular relaxation (grade 1 diastolic dysfunction).   Doppler parameters are consistent with indeterminate ventricular   filling pressure. - Aortic valve: Transvalvular velocity was within the normal range.   There was no stenosis. There was no regurgitation. - Mitral valve: Transvalvular velocity was within the normal range.   There was no evidence for stenosis. There was no regurgitation. - Right ventricle: The cavity size was normal. Wall thickness was   normal. Systolic function was normal. - Atrial septum: No defect or patent foramen ovale was identified. - Tricuspid valve: There was trivial regurgitation. - Pulmonary arteries: Systolic pressure was within the normal   range. PA peak pressure: 23 mm Hg (S).  EKG:  EKG is NSR 68 ordered today.  The ekg ordered today demonstrates NSSTW changes.  Recent Labs: 11/15/2018: TSH 3.56 01/17/2019: ALT 32; BUN 12; Creatinine, Ser 1.34; Hemoglobin 16.1; Platelets 393; Potassium 4.1; Sodium 137  Recent Lipid Panel No results found for: CHOL, TRIG, HDL, CHOLHDL, VLDL, LDLCALC, LDLDIRECT  Physical Exam:    VS:  BP 120/80   Pulse 68   Ht 5\' 10"  (1.778 m)   Wt 243 lb (110.2 kg)   BMI 34.87 kg/m     Wt Readings from Last 3 Encounters:  05/25/19 243 lb (110.2 kg)  11/15/18 241 lb 3.2 oz (109.4 kg)  09/11/18 238 lb 9.6 oz (108.2 kg)     GEN:  Well  nourished, well developed in no acute distress HEENT: Normal NECK: No JVD; No carotid bruits LYMPHATICS: No lymphadenopathy CARDIAC: RRR, no murmurs, rubs, gallops RESPIRATORY:  Clear to auscultation without rales, wheezing or rhonchi, he did cough with deep inspiration ABDOMEN: Soft, non-tender, non-distended MUSCULOSKELETAL:  No edema; No deformity  SKIN: Warm and dry NEUROLOGIC:  Alert and oriented x 3 PSYCHIATRIC:  Normal affect  ASSESSMENT:    1. Tachycardia   2. SOB (shortness of breath)   3. Cough, persistent    PLAN:    In order of problems listed above:  Tachycardia - Prior echocardiogram reassuring with normal pump function, no evidence of atrial septal defect on Doppler, normal pulmonary estimated pressures. -Holter monitor was reassuring other than slightly higher heart rate than usual, no adverse arrhythmias. - Conservative management such as decreasing caffeine and exercise. - No evidence of carcinoid or pheochromocytoma from blood work. -Continuing with diltiazem which has helped overall control some of his heart rate.  He is still tachycardic however at times.  Discussed that this may be a degree of dysautonomia as well.  He is not having any syncope.  Cough/shortness of breath with activity - Cough occurs during exercise, sometimes it become so vigorous that he could vomit.  I would like for him to see pulmonary for further evaluation.  Fatigue -He wonders if it is from his episode of Epstein-Barr virus back at age 17.   Medication Adjustments/Labs and Tests Ordered: Current medicines are reviewed at length with the patient today.  Concerns regarding medicines are outlined above.  Orders Placed This Encounter  Procedures  . Ambulatory referral to Pulmonology  . EKG 12-Lead   No orders of the defined types were placed in this encounter.   Patient Instructions  Medication Instructions:  The current medical regimen is effective;  continue present plan  and medications.  If you need a refill on your cardiac medications before your next appointment, please call your pharmacy.   Testing/Procedures: You are being referred to Pulmonary for further evaluation of shortness of breath and cough.  Follow-Up: At [facility], you and your health needs are our priority.  As part of our continuing mission to provide you with exceptional heart care, we have created designated Provider Care Teams.  These Care Teams include your primary Cardiologist (physician) and Advanced Practice Providers (APPs -  Physician Assistants and Nurse Practitioners) who all work together to provide you with the care you need, when you need it. You will need a follow up appointment in 12 months.  Please call our office 2 months in advance to schedule this appointment.  You may see Candee Furbish, MD or one of the following Advanced Practice Providers on your designated Care Team:   Truitt Merle, NP Cecilie Kicks, NP . Kathyrn Drown, NP  Thank you for choosing [facility]!!         Signed, Candee Furbish, MD  05/25/2019 2:37 PM    [place]

## 2019-06-26 DIAGNOSIS — L710 Perioral dermatitis: Secondary | ICD-10-CM | POA: Diagnosis not present

## 2019-07-18 ENCOUNTER — Encounter: Payer: Self-pay | Admitting: Pulmonary Disease

## 2019-07-18 ENCOUNTER — Other Ambulatory Visit: Payer: Self-pay

## 2019-07-18 ENCOUNTER — Ambulatory Visit (INDEPENDENT_AMBULATORY_CARE_PROVIDER_SITE_OTHER): Payer: BC Managed Care – PPO | Admitting: Pulmonary Disease

## 2019-07-18 VITALS — BP 122/80 | HR 116 | Temp 98.3°F | Ht 70.5 in | Wt 236.4 lb

## 2019-07-18 DIAGNOSIS — Z6833 Body mass index (BMI) 33.0-33.9, adult: Secondary | ICD-10-CM

## 2019-07-18 DIAGNOSIS — R0602 Shortness of breath: Secondary | ICD-10-CM

## 2019-07-18 DIAGNOSIS — F1290 Cannabis use, unspecified, uncomplicated: Secondary | ICD-10-CM | POA: Diagnosis not present

## 2019-07-18 DIAGNOSIS — E6609 Other obesity due to excess calories: Secondary | ICD-10-CM

## 2019-07-18 DIAGNOSIS — U070 Vaping-related disorder: Secondary | ICD-10-CM

## 2019-07-18 NOTE — Progress Notes (Signed)
Synopsis: Referred in Aug 2020 for SOB by Jake BatheSkains, Mark C, MD  Subjective:   PATIENT ID: Peter ElizabethAndrew Blankenship GENDER: male DOB: [birth year], MRN: [id number]  Chief Complaint  Patient presents with  . Consult    Referral from Dr.Skains. Reports dry cough started 6 years ago. Report SOB with exertion and increased heart rate. Denies chest pain. Peter Blankenship reports Peter Blankenship cannot take a deep breath.     PMH of EBV, mono, at age 33 years, [facility]. Peter Blankenship put on a ton of weight after this. Peter Blankenship started loosing weight at age 33, lost 70 lbs and has regained some of it back since then. Started notices some shortness of breath. This never really got any better. Peter Blankenship went to a pulmonary doc at age [age] yo. Peter Blankenship had pulmonary function test and Peter Blankenship had several bouts of cough. Peter Blankenship was told that his pulmonary function tests were normal. Peter Blankenship works for a [employer] firm and [place] and then [place]. Lives with wife no children, no dog, no pets, free time is into video games. Peter Blankenship has had full body rashes more than once. Peter Blankenship has seen an allergist and Peter Blankenship has no reason to get his recurrent urticaria.  Peter Blankenship has pictures on his phone of his recurrent body urticaria.  Peter Blankenship treats this with as needed antihistamines and Benadryl at home.  Peter Blankenship has seen a dermatologist in the past as well as allergist.   Past Medical History:  Diagnosis Date  . Angio-edema   . Eczema   . Urticaria      No family history on file.   Past Surgical History:  Procedure Laterality Date  . WISDOM TOOTH EXTRACTION Bilateral 2013    Social History   Socioeconomic History  . Marital status: Single    Spouse name: Not on file  . Number of children: Not on file  . Years of education: Not on file  . Highest education level: Not on file  Occupational History  . Not on file  Social Needs  . Financial resource strain: Not on file  . Food insecurity    Worry: Not on file    Inability: Not on  file  . Transportation needs    Medical: Not on file    Non-medical: Not on file  Tobacco Use  . Smoking status: Never Smoker  . Smokeless tobacco: Never Used  Substance and Sexual Activity  . Alcohol use: Yes    Comment: occasional   . Drug use: Yes    Types: Marijuana  . Sexual activity: Not on file  Lifestyle  . Physical activity    Days per week: Not on file    Minutes per session: Not on file  . Stress: Not on file  Relationships  . Social Musicianconnections    Talks on phone: Not on file    Gets together: Not on file    Attends religious service: Not on file    Active member of club or organization: Not on file    Attends meetings of clubs or organizations: Not on file    Relationship status: Not on file  . Intimate partner violence    Fear of current or ex partner: Not on file    Emotionally abused: Not on file    Physically abused: Not on file    Forced sexual activity: Not on file  Other Topics Concern  . Not on file  Social History Narrative  .  Not on file     No Known Allergies   Outpatient Medications Prior to Visit  Medication Sig Dispense Refill  . diltiazem (CARDIZEM CD) 180 MG 24 hr capsule Take 1 capsule (180 mg total) by mouth daily. 90 capsule 3   No facility-administered medications prior to visit.     Review of Systems  Constitutional: Negative for chills, fever, malaise/fatigue and weight loss.  HENT: Negative for hearing loss, sore throat and tinnitus.   Eyes: Negative for blurred vision and double vision.  Respiratory: Positive for shortness of breath. Negative for cough, hemoptysis, sputum production, wheezing and stridor.   Cardiovascular: Positive for palpitations. Negative for chest pain, orthopnea, leg swelling and PND.  Gastrointestinal: Negative for abdominal pain, constipation, diarrhea, heartburn, nausea and vomiting.  Genitourinary: Negative for dysuria, hematuria and urgency.  Musculoskeletal: Negative for joint pain and myalgias.   Skin: Negative for itching and rash.  Neurological: Negative for dizziness, tingling, weakness and headaches.  Endo/Heme/Allergies: Negative for environmental allergies. Does not bruise/bleed easily.  Psychiatric/Behavioral: Negative for depression. The patient is not nervous/anxious and does not have insomnia.   All other systems reviewed and are negative.    Objective:  Physical Exam Vitals signs reviewed.  Constitutional:      General: Peter Blankenship is not in acute distress.    Appearance: Peter Blankenship is well-developed. Peter Blankenship is obese.  HENT:     Head: Normocephalic and atraumatic.  Eyes:     General: No scleral icterus.    Conjunctiva/sclera: Conjunctivae normal.     Pupils: Pupils are equal, round, and reactive to light.  Neck:     Musculoskeletal: Neck supple.     Vascular: No JVD.     Trachea: No tracheal deviation.  Cardiovascular:     Rate and Rhythm: Normal rate and regular rhythm.     Heart sounds: Normal heart sounds. No murmur.  Pulmonary:     Effort: Pulmonary effort is normal. No tachypnea, accessory muscle usage or respiratory distress.     Breath sounds: Normal breath sounds. No stridor. No wheezing, rhonchi or rales.  Abdominal:     General: Bowel sounds are normal. There is no distension.     Palpations: Abdomen is soft.     Tenderness: There is no abdominal tenderness.     Comments: Obese soft  Musculoskeletal:        General: No tenderness.  Lymphadenopathy:     Cervical: No cervical adenopathy.  Skin:    General: Skin is warm and dry.     Capillary Refill: Capillary refill takes less than 2 seconds.     Findings: No rash.  Neurological:     Mental Status: Peter Blankenship is alert and oriented to person, place, and time.  Psychiatric:        Behavior: Behavior normal.      Vitals:   07/18/19 1615  BP: 122/80  Pulse: (!) 116  Temp: 98.3 F (36.8 C)  TempSrc: Oral  SpO2: 97%  Weight: 236 lb 6.4 oz (107.2 kg)  Height: 5' 10.5" (1.791 m)   97% on RA BMI Readings from Last  3 Encounters:  07/18/19 33.44 kg/m  05/25/19 34.87 kg/m  11/15/18 34.61 kg/m   Wt Readings from Last 3 Encounters:  07/18/19 236 lb 6.4 oz (107.2 kg)  05/25/19 243 lb (110.2 kg)  11/15/18 241 lb 3.2 oz (109.4 kg)     CBC    Component Value Date/Time   WBC 9.7 01/17/2019 0401   RBC 5.11 01/17/2019 0401  HGB 16.1 01/17/2019 0401   HCT 45.1 01/17/2019 0401   PLT 393 01/17/2019 0401   MCV 88.3 01/17/2019 0401   MCH 31.5 01/17/2019 0401   MCHC 35.7 01/17/2019 0401   RDW 11.9 01/17/2019 0401    Chest Imaging: No recent chest imaging  Pulmonary Functions Testing Results: No flowsheet data found.  FeNO: None   Pathology: None   Echocardiogram:  09/2018 - Left ventricle: The cavity size was normal. There was mild   concentric hypertrophy. Systolic function was normal. The   estimated ejection fraction was in the range of 55% to 60%. Wall   motion was normal; there were no regional wall motion   abnormalities. Doppler parameters are consistent with abnormal   left ventricular relaxation (grade 1 diastolic dysfunction).   Doppler parameters are consistent with indeterminate ventricular   filling pressure. - Aortic valve: Transvalvular velocity was within the normal range.   There was no stenosis. There was no regurgitation. - Mitral valve: Transvalvular velocity was within the normal range.   There was no evidence for stenosis. There was no regurgitation. - Right ventricle: The cavity size was normal. Wall thickness was   normal. Systolic function was normal. - Atrial septum: No defect or patent foramen ovale was identified. - Tricuspid valve: There was trivial regurgitation. - Pulmonary arteries: Systolic pressure was within the normal   range. PA peak pressure: 23 mm Hg (S).  Heart Catheterization: None     Assessment & Plan:     ICD-10-CM   1. 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 Uses marijuana   F12.90   4. Class 1 obesity due to excess calories without serious comorbidity with body mass index (BMI) of 33.0 to 33.9 in adult  E66.09    Z68.33     Discussion:  This is a 33 year old gentleman with recurrent issues regarding shortness of breath predominantly with exertion.  We talked about various etiologies of his dyspnea.  Peter Blankenship does not have any associated symptoms consistent with atopy, no nocturnal symptoms no wheezing.  No prior history of asthma or family history of asthma symptoms.  Peter Blankenship has had increased weight gain for the past several years.  Peter Blankenship believes this may be related to some of his shortness of breath. Peter Blankenship has had pulmonary function test several years ago but unable to review these from [place] with a pulmonologist before. Peter Blankenship is also using vaping marijuana at least once a week.  This is likely not helping him.  Peter Blankenship does have a resting sinus tachycardia.  Peter Blankenship is on medications with this.  Cardiology has seen him before but unsure of the etiology of his resting sinus tachycardia.  I encouraged him to continue weight loss. If his PFTs have any significant abnormality then we could consider high-resolution CT imaging of the chest to rule out any parenchymal abnormalities.  If Peter Blankenship becomes completely insistent on helping determine etiology of shortness of breath we can consider cardiopulmonary exercise testing.  But I do not believe this will change our management.  Peter Blankenship is likely reassuring that it is not his heart with a normal cardiac evaluation.  Especially if Peter Blankenship has normal pulmonary function.  Patient to follow with us in 2 to 3 weeks after his pulmonary function test have been completed.  Greater than 50% of this patient's 45-minute of visit was been face-to-face discussing above recommendations and treatment plan, reviewing patient history and cardiac evaluation.  Current Outpatient Medications:  .  diltiazem (CARDIZEM CD) 180 MG 24 hr capsule, Take 1 capsule (180 mg total) by  mouth daily., Disp: 90 capsule, Rfl: 3   Garner Nash, DO [facility] 07/18/2019 4:56 PM

## 2019-07-18 NOTE — Patient Instructions (Signed)
Thank you for visiting Dr. Valeta Harms at [facility].  Today we recommend the following: Orders Placed This Encounter  Procedures  . Pulmonary Function Test    Return in about 3 weeks (around 08/08/2019).    Please do your part to reduce the spread of COVID-19.

## 2019-07-28 DIAGNOSIS — M9903 Segmental and somatic dysfunction of lumbar region: Secondary | ICD-10-CM | POA: Diagnosis not present

## 2019-07-28 DIAGNOSIS — M5134 Other intervertebral disc degeneration, thoracic region: Secondary | ICD-10-CM | POA: Diagnosis not present

## 2019-07-28 DIAGNOSIS — M5386 Other specified dorsopathies, lumbar region: Secondary | ICD-10-CM | POA: Diagnosis not present

## 2019-07-28 DIAGNOSIS — M5137 Other intervertebral disc degeneration, lumbosacral region: Secondary | ICD-10-CM | POA: Diagnosis not present

## 2019-07-30 DIAGNOSIS — M5386 Other specified dorsopathies, lumbar region: Secondary | ICD-10-CM | POA: Diagnosis not present

## 2019-07-30 DIAGNOSIS — M9903 Segmental and somatic dysfunction of lumbar region: Secondary | ICD-10-CM | POA: Diagnosis not present

## 2019-07-30 DIAGNOSIS — M5137 Other intervertebral disc degeneration, lumbosacral region: Secondary | ICD-10-CM | POA: Diagnosis not present

## 2019-07-30 DIAGNOSIS — M5134 Other intervertebral disc degeneration, thoracic region: Secondary | ICD-10-CM | POA: Diagnosis not present

## 2019-08-04 DIAGNOSIS — M5386 Other specified dorsopathies, lumbar region: Secondary | ICD-10-CM | POA: Diagnosis not present

## 2019-08-04 DIAGNOSIS — M9903 Segmental and somatic dysfunction of lumbar region: Secondary | ICD-10-CM | POA: Diagnosis not present

## 2019-08-04 DIAGNOSIS — M5137 Other intervertebral disc degeneration, lumbosacral region: Secondary | ICD-10-CM | POA: Diagnosis not present

## 2019-08-04 DIAGNOSIS — M5134 Other intervertebral disc degeneration, thoracic region: Secondary | ICD-10-CM | POA: Diagnosis not present

## 2019-08-07 DIAGNOSIS — J020 Streptococcal pharyngitis: Secondary | ICD-10-CM | POA: Diagnosis not present

## 2019-08-08 DIAGNOSIS — M5134 Other intervertebral disc degeneration, thoracic region: Secondary | ICD-10-CM | POA: Diagnosis not present

## 2019-08-08 DIAGNOSIS — M5386 Other specified dorsopathies, lumbar region: Secondary | ICD-10-CM | POA: Diagnosis not present

## 2019-08-08 DIAGNOSIS — M5137 Other intervertebral disc degeneration, lumbosacral region: Secondary | ICD-10-CM | POA: Diagnosis not present

## 2019-08-08 DIAGNOSIS — M9903 Segmental and somatic dysfunction of lumbar region: Secondary | ICD-10-CM | POA: Diagnosis not present

## 2019-08-16 DIAGNOSIS — M5386 Other specified dorsopathies, lumbar region: Secondary | ICD-10-CM | POA: Diagnosis not present

## 2019-08-16 DIAGNOSIS — M9903 Segmental and somatic dysfunction of lumbar region: Secondary | ICD-10-CM | POA: Diagnosis not present

## 2019-08-16 DIAGNOSIS — M5137 Other intervertebral disc degeneration, lumbosacral region: Secondary | ICD-10-CM | POA: Diagnosis not present

## 2019-08-16 DIAGNOSIS — M5134 Other intervertebral disc degeneration, thoracic region: Secondary | ICD-10-CM | POA: Diagnosis not present

## 2019-08-23 DIAGNOSIS — M9903 Segmental and somatic dysfunction of lumbar region: Secondary | ICD-10-CM | POA: Diagnosis not present

## 2019-08-23 DIAGNOSIS — M5134 Other intervertebral disc degeneration, thoracic region: Secondary | ICD-10-CM | POA: Diagnosis not present

## 2019-08-23 DIAGNOSIS — M5137 Other intervertebral disc degeneration, lumbosacral region: Secondary | ICD-10-CM | POA: Diagnosis not present

## 2019-08-23 DIAGNOSIS — M5386 Other specified dorsopathies, lumbar region: Secondary | ICD-10-CM | POA: Diagnosis not present

## 2019-09-01 DIAGNOSIS — M5137 Other intervertebral disc degeneration, lumbosacral region: Secondary | ICD-10-CM | POA: Diagnosis not present

## 2019-09-01 DIAGNOSIS — M9903 Segmental and somatic dysfunction of lumbar region: Secondary | ICD-10-CM | POA: Diagnosis not present

## 2019-09-01 DIAGNOSIS — M5386 Other specified dorsopathies, lumbar region: Secondary | ICD-10-CM | POA: Diagnosis not present

## 2019-09-01 DIAGNOSIS — M5134 Other intervertebral disc degeneration, thoracic region: Secondary | ICD-10-CM | POA: Diagnosis not present

## 2019-09-05 DIAGNOSIS — M5137 Other intervertebral disc degeneration, lumbosacral region: Secondary | ICD-10-CM | POA: Diagnosis not present

## 2019-09-05 DIAGNOSIS — M5386 Other specified dorsopathies, lumbar region: Secondary | ICD-10-CM | POA: Diagnosis not present

## 2019-09-05 DIAGNOSIS — M5134 Other intervertebral disc degeneration, thoracic region: Secondary | ICD-10-CM | POA: Diagnosis not present

## 2019-09-05 DIAGNOSIS — M9903 Segmental and somatic dysfunction of lumbar region: Secondary | ICD-10-CM | POA: Diagnosis not present

## 2019-09-08 DIAGNOSIS — M5137 Other intervertebral disc degeneration, lumbosacral region: Secondary | ICD-10-CM | POA: Diagnosis not present

## 2019-09-08 DIAGNOSIS — M5134 Other intervertebral disc degeneration, thoracic region: Secondary | ICD-10-CM | POA: Diagnosis not present

## 2019-09-08 DIAGNOSIS — M5386 Other specified dorsopathies, lumbar region: Secondary | ICD-10-CM | POA: Diagnosis not present

## 2019-09-08 DIAGNOSIS — M9903 Segmental and somatic dysfunction of lumbar region: Secondary | ICD-10-CM | POA: Diagnosis not present

## 2019-11-05 ENCOUNTER — Other Ambulatory Visit: Payer: Self-pay

## 2019-11-05 DIAGNOSIS — Z20822 Contact with and (suspected) exposure to covid-19: Secondary | ICD-10-CM

## 2019-11-06 LAB — NOVEL CORONAVIRUS, NAA: SARS-CoV-2, NAA: NOT DETECTED

## 2019-12-27 ENCOUNTER — Encounter: Payer: Self-pay | Admitting: Allergy & Immunology

## 2019-12-27 ENCOUNTER — Ambulatory Visit (INDEPENDENT_AMBULATORY_CARE_PROVIDER_SITE_OTHER): Payer: BC Managed Care – PPO | Admitting: Allergy & Immunology

## 2019-12-27 ENCOUNTER — Other Ambulatory Visit: Payer: Self-pay

## 2019-12-27 VITALS — BP 110/90 | HR 110 | Temp 97.7°F | Resp 16 | Ht 70.0 in | Wt 238.6 lb

## 2019-12-27 DIAGNOSIS — L501 Idiopathic urticaria: Secondary | ICD-10-CM

## 2019-12-27 DIAGNOSIS — R0602 Shortness of breath: Secondary | ICD-10-CM | POA: Diagnosis not present

## 2019-12-27 DIAGNOSIS — R000 Tachycardia, unspecified: Secondary | ICD-10-CM

## 2019-12-27 DIAGNOSIS — R232 Flushing: Secondary | ICD-10-CM | POA: Diagnosis not present

## 2019-12-27 MED ORDER — EPINEPHRINE 0.3 MG/0.3ML IJ SOAJ: 0.3000 mg | Freq: Once | INTRAMUSCULAR | 1 refills | Status: AC

## 2019-12-27 MED ORDER — OMALIZUMAB 150 MG ~~LOC~~ SOLR
300.0000 mg | SUBCUTANEOUS | Status: DC
  Administered 2019-12-27 – 2020-10-09 (×11): 300 mg via SUBCUTANEOUS

## 2019-12-27 NOTE — Patient Instructions (Addendum)
1. Allergic reaction versus chronic urticaria - We gave a sample of Xolair today. - It can take a few doses to take full effect.  - This can work on hives as well as breathing. - Consent signed. Audry Riles training provided.  2. Return in about 3 months (around 03/26/2020).   Please inform us of any Emergency Department visits, hospitalizations, or changes in symptoms. Call us before going to the ED for breathing or allergy symptoms since we might be able to fit you in for a sick visit. Feel free to contact us anytime with any questions, problems, or concerns.  It was a pleasure to see you again today!  Websites that have reliable patient information: 1. American Academy of Asthma, Allergy, and Immunology: www.aaaai.org 2. Food Allergy Research and Education (FARE): foodallergy.org 3. Mothers of Asthmatics: http://www.asthmacommunitynetwork.org 4. American College of Allergy, Asthma, and Immunology: MissingWeapons.ca   Make sure you are registered to vote! If you have moved or changed any of your contact information, you will need to get this updated before voting!

## 2019-12-27 NOTE — Progress Notes (Signed)
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 Donato Schultz)  Shortness of breath - no improvement with Amere Bricco continues to have problems with his allergic reactions. In fact, unfortunately, they are becoming more frequent despite daily antihistamine dosing. Therefore, we will advance his treatment to Xolair to see if this can act as an immunomodulatory agent to prevent these reactions. It should definitely help with the urticarial episode, but I am not convinced that it will do much to help with the tachycardia issues.   Plan/Recommendations:   1. Allergic reaction versus chronic urticaria - We gave a sample of Xolair today. - It can take a few doses to take full effect.  - This can work on hives as well as breathing. - Consent signed. Peter Blankenship training provided.  2. Return in about 3 months (around 03/26/2020).  Subjective:   Peter Blankenship is a 34 y.o. male presenting today for follow up of  Chief Complaint  Patient presents with  . Allergic Reaction    full body hives, has not had any issues since last visit. feels like he "catches it and takes benadryl which help  . Other    having SOB and a cough, has been using antihistamines to help  . other    having some discoloration on face, nose and cheeks are a different color    Peter Blankenship has a history of the following: Patient Active Problem List   Diagnosis Date Noted  . Flushing 11/15/2018  . 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 Tachycardia 08/19/2018  . SOB (shortness of breath) 08/19/2018    History obtained from: chart review and patient.  Peter Blankenship is a 35 y.o. male presenting for a follow up visit.  He was last seen in October 2019.  At that time, we recommended continuing with daily cetirizine to prevent future reactions.  We had ruled out some serious causes of anaphylaxis.  All of the testing we did  to foods was negative including wheat, milk, casein, barley, oat, rye, hops, rice, orange, and pineapple.  We did discuss the use of Xolair as a means of an immunomodulatory agent, but I felt this was aggressive since these episodes occurred so rarely.  For shortness of breath, we held the [facility] because it did not seem to be helping.  For his tachycardia, we recommended continuing up with his cardiologist.  Since the last visit, he continued to have some problems. He did have an echocardiogram and EKG which were normal. He was started on Diltiazem 180mg  and this did not see mto do anything. He continues to have a resting heart rate above 100, although it will sometimes get down into the 80s. He has now changed to ketogenic diet since he is trying to lose more weight.   Asthma/Respiratory Symptom History: He was on Breo, but he tells me that he did not do this for a full period of time to see if it did anything. He did see a Pulmonologist who told him that he was "fat".  He has not restarted the [facility], but he has noticed that with regular use of his antihistamine, his breathing seems to be somewhat better.   Allergic Reaction Symptom History: He continues to have these allergic reactions, which he treats with PRN uses of antihistamines. Over the past week, he has started taking Claritin-D which he thinks might be helping his breathing.  He had never tried using an antihistamine on a daily basis. This did not seem to make his tachycardia any worse. For the most part, it is exactly the same. They are becoming more frequent or he is being very cautious and cognizant of his symptoms. Regardless, he is ready to talk about additional steps. These reactions continue to consist of hives over his entire body, associated with breathing difficulties.   Otherwise, there have been no changes to his past medical history, surgical history, family history, or social history.    Review of Systems  Constitutional: Negative.   Negative for chills, fever, malaise/fatigue and weight loss.  HENT: Negative.  Negative for congestion, ear discharge and ear pain.   Eyes: Negative for pain, discharge and redness.  Respiratory: Positive for cough. Negative for sputum production, shortness of breath and wheezing.   Cardiovascular: Negative.  Negative for chest pain and palpitations.  Gastrointestinal: Negative for abdominal pain, heartburn, nausea and vomiting.  Skin: Positive for itching and rash.  Neurological: Negative for dizziness and headaches.  Endo/Heme/Allergies: Negative for environmental allergies. Does not bruise/bleed easily.       Objective:   Blood pressure 110/90, pulse (!) 110, temperature 97.7 F (36.5 C), temperature source Temporal, resp. rate 16, height 5\' 10"  (1.778 m), weight 238 lb 9.6 oz (108.2 kg), SpO2 96 %. Body mass index is 34.24 kg/m.   Physical Exam:  Physical Exam  Constitutional: He appears well-developed.  Very pleasant male. Cooperative with the exam.   HENT:  Head: Normocephalic and atraumatic.  Right Ear: Tympanic membrane, external ear and ear canal normal.  Left Ear: Tympanic membrane, external ear and ear canal normal.  Nose: Mucosal edema and rhinorrhea present. No nasal deformity or septal deviation. No epistaxis. Right sinus exhibits no maxillary sinus tenderness and no frontal sinus tenderness. Left sinus exhibits no maxillary sinus tenderness and no frontal sinus tenderness.  Mouth/Throat: Uvula is midline and oropharynx is clear and moist. Mucous membranes are not pale and not dry.  Minimal cobblestoning in the posterior oropharynx.   Eyes: Pupils are equal, round, and reactive to light. Conjunctivae and EOM are normal. Right eye exhibits no chemosis and no discharge. Left eye exhibits no chemosis and no discharge. Right conjunctiva is not injected. Left conjunctiva is not injected.  Cardiovascular: Normal rate, regular rhythm and normal heart sounds.  Respiratory:  Effort normal and breath sounds normal. No accessory muscle usage. No tachypnea. No respiratory distress. He has no wheezes. He has no rhonchi. He has no rales. He exhibits no tenderness.  Moving air well in all lung fields.   Lymphadenopathy:    He has no cervical adenopathy.  Neurological: He is alert.  Skin: No abrasion, no petechiae and no rash noted. Rash is not papular, not vesicular and not urticarial. No erythema. No pallor.  There is some flushing present bilaterally.   Psychiatric: He has a normal mood and affect.     Diagnostic studies: none  Xolair 300 mg administered in clinic. Patient tolerated dose without adverse event.       Salvatore Marvel, MD  [facility]

## 2020-01-01 ENCOUNTER — Telehealth: Payer: Self-pay | Admitting: Allergy & Immunology

## 2020-01-01 NOTE — Telephone Encounter (Signed)
Pt called and wanted you to let him know any primary droctor to see in this area since he is new here. [phone].

## 2020-01-01 NOTE — Telephone Encounter (Signed)
Please advise 

## 2020-01-02 NOTE — Telephone Encounter (Signed)
Call to patient, phone number to Dr Milinda Cave given .  Pt was very appreciative. Call ended.

## 2020-01-02 NOTE — Telephone Encounter (Signed)
I honestly have no idea. I see Dr. Milinda Cave at [facility] and I like him. I am sure they have openings.   When I was looking for a PCP, the  groups in town did not have openings.   Malachi Bonds, MD [facility]

## 2020-01-02 NOTE — Telephone Encounter (Signed)
Dr McGowen's number     [phone]

## 2020-01-10 ENCOUNTER — Telehealth: Payer: Self-pay | Admitting: *Deleted

## 2020-01-10 NOTE — Telephone Encounter (Signed)
Called and discussed with patient xolair with buy and bill and copay card. Patient already have next appt set up

## 2020-01-21 ENCOUNTER — Other Ambulatory Visit: Payer: Self-pay

## 2020-01-21 ENCOUNTER — Encounter: Payer: Self-pay | Admitting: Family Medicine

## 2020-01-21 ENCOUNTER — Ambulatory Visit (INDEPENDENT_AMBULATORY_CARE_PROVIDER_SITE_OTHER): Payer: BC Managed Care – PPO | Admitting: Family Medicine

## 2020-01-21 VITALS — BP 130/87 | HR 79 | Temp 98.6°F | Resp 16 | Ht 70.0 in | Wt 245.2 lb

## 2020-01-21 DIAGNOSIS — R05 Cough: Secondary | ICD-10-CM | POA: Diagnosis not present

## 2020-01-21 DIAGNOSIS — R0602 Shortness of breath: Secondary | ICD-10-CM

## 2020-01-21 DIAGNOSIS — E663 Overweight: Secondary | ICD-10-CM | POA: Diagnosis not present

## 2020-01-21 DIAGNOSIS — E348 Other specified endocrine disorders: Secondary | ICD-10-CM | POA: Diagnosis not present

## 2020-01-21 DIAGNOSIS — E349 Endocrine disorder, unspecified: Secondary | ICD-10-CM

## 2020-01-21 DIAGNOSIS — R059 Cough, unspecified: Secondary | ICD-10-CM

## 2020-01-21 LAB — CBC WITH DIFFERENTIAL/PLATELET
Basophils Absolute: 0.1 10*3/uL (ref 0.0–0.1)
Basophils Relative: 1.1 % (ref 0.0–3.0)
Eosinophils Absolute: 0.1 10*3/uL (ref 0.0–0.7)
Eosinophils Relative: 2.1 % (ref 0.0–5.0)
HCT: 42.5 % (ref 39.0–52.0)
Hemoglobin: 14.7 g/dL (ref 13.0–17.0)
Lymphocytes Relative: 23.6 % (ref 12.0–46.0)
Lymphs Abs: 1.5 10*3/uL (ref 0.7–4.0)
MCHC: 34.6 g/dL (ref 30.0–36.0)
MCV: 90 fl (ref 78.0–100.0)
Monocytes Absolute: 0.4 10*3/uL (ref 0.1–1.0)
Monocytes Relative: 5.6 % (ref 3.0–12.0)
Neutro Abs: 4.3 10*3/uL (ref 1.4–7.7)
Neutrophils Relative %: 67.6 % (ref 43.0–77.0)
Platelets: 327 10*3/uL (ref 150.0–400.0)
RBC: 4.72 Mil/uL (ref 4.22–5.81)
RDW: 13.5 % (ref 11.5–15.5)
WBC: 6.3 10*3/uL (ref 4.0–10.5)

## 2020-01-21 LAB — COMPREHENSIVE METABOLIC PANEL
ALT: 32 U/L (ref 0–53)
AST: 24 U/L (ref 0–37)
Albumin: 4.4 g/dL (ref 3.5–5.2)
Alkaline Phosphatase: 73 U/L (ref 39–117)
BUN: 9 mg/dL (ref 6–23)
CO2: 30 mEq/L (ref 19–32)
Calcium: 9.9 mg/dL (ref 8.4–10.5)
Chloride: 100 mEq/L (ref 96–112)
Creatinine, Ser: 0.98 mg/dL (ref 0.40–1.50)
GFR: 87.1 mL/min (ref >60.00)
Glucose, Bld: 88 mg/dL (ref 70–99)
Potassium: 4 mEq/L (ref 3.5–5.1)
Sodium: 138 mEq/L (ref 135–145)
Total Bilirubin: 0.5 mg/dL (ref 0.2–1.2)
Total Protein: 7.2 g/dL (ref 6.0–8.3)

## 2020-01-21 LAB — LDL CHOLESTEROL, DIRECT: Direct LDL: 148 mg/dL

## 2020-01-21 LAB — TSH: TSH: 2.66 u[IU]/mL (ref 0.35–4.50)

## 2020-01-21 LAB — LUTEINIZING HORMONE: LH: 4.07 m[IU]/mL (ref 1.50–9.30)

## 2020-01-21 LAB — HEMOGLOBIN A1C: Hgb A1c MFr Bld: 5.1 % (ref 4.6–6.5)

## 2020-01-21 LAB — LIPID PANEL
Cholesterol: 246 mg/dL — ABNORMAL HIGH (ref 0–200)
HDL: 47 mg/dL (ref >39.00)
NonHDL: 198.95
Total CHOL/HDL Ratio: 5
Triglycerides: 324 mg/dL — ABNORMAL HIGH (ref 0.0–149.0)
VLDL: 64.8 mg/dL — ABNORMAL HIGH (ref 0.0–40.0)

## 2020-01-21 LAB — SEDIMENTATION RATE: Sed Rate: 10 mm/hr (ref 0–15)

## 2020-01-21 MED ORDER — DILTIAZEM HCL ER COATED BEADS 240 MG PO CP24: 240.0000 mg | ORAL_CAPSULE | Freq: Every day | ORAL | 1 refills | Status: DC

## 2020-01-21 MED ORDER — FINASTERIDE 1 MG PO TABS: 1.0000 mg | ORAL_TABLET | Freq: Every day | ORAL | 3 refills | Status: DC

## 2020-01-21 NOTE — Progress Notes (Signed)
Office Note 01/21/2020  CC:  Chief Complaint  Patient presents with  . Establish Care    Previous PCP, 10 years ago    HPI:  Peter Blankenship is a 34 y.o. male who is here to establish care Patient's most recent primary MD: none-->last was >10 yrs ago. Old records in EPIC/[facility] EMR were reviewed prior to or during today's visit.  Moved here 01/2018. Many chronic med issues pt is frustrated with b/c no one can figure them out of make him feel significantly better. Long hx of tachycardia: seems fairly well controlled currently on dilt, feels inc HR sometimes but not particularly a bothersome thing.  However, he is still worried about it and limits his exercise b/c of fear of prolonged tachy causing something bad..  No dizziness.  BP has been normal except last couple MD visits a little over 130/80. With CV exercise he says he gets immediate coughing fit and HR elevation to 180-190.  Calms down pretty quick with rest.  No dizziness or CP. Cardiologist : dilt inc from 120-180, mild imp. No f/u with cardiologist at this time. See PMH section for problem/work up details.  Pulm: plans on continuing pulm w/u at some point. Morning cough, also sometimes after he eats (no matter what food type). No nocturnal symptoms.  No wheezing.  He vaps marijuana about once a month.  No cigs. No hx of imaging of lungs.  An albut inhaler did not help anything.  Feels tired an exhausted a lot of the time. Says free testost was very low in the past couple years ago, says total T was fine.  Sounds like testost pellets no help but clomid seemed to help.  He has dec libido. Starting to see some hair loss, male pattern, wants to try propecia.  Reports long hx of some mild/subtle facial discoloration around nose and mouth, couple spots on zygomatic areas of cheeks.  Asympt. Just a bit bumpy.  Derm has not been able to give him a dx per his report today.  Hx idiopathic urticaria.  Started Lexicographer a month ago, he's not  sure if helping anything any.   Past Medical History:  Diagnosis Date  . Angio-edema   . Chronic fatigue   . Diverticulosis   . DOE (dyspnea on exertion)    [place]FTs [place] ([place]) per pt.  PFTs ordered here 07/2019, not completed as of 01/2020.  Marland Kitchen Eczema   . Tachycardia    Dr. Marlou Porch[place]- no arrhythmia.  w/u NEG.  Dilt rx'd.  Marland Kitchen Urticaria     Past Surgical History:  Procedure Laterality Date  . TRANSTHORACIC ECHOCARDIOGRAM  09/13/2018   EF 55-60%, grd I DD.  Valves normal.  . WISDOM TOOTH EXTRACTION Bilateral 2013    Family History  Problem Relation Age of Onset  . High Cholesterol Mother   . Testicular cancer Brother   . High Cholesterol Brother     Social History   Socioeconomic History  . Marital status: Married    Spouse name: Not on file  . Number of children: Not on file  . Years of education: Not on file  . Highest education level: Not on file  Occupational History  . Not on file  Tobacco Use  . Smoking status: Never Smoker  . Smokeless tobacco: Never Used  Substance and Sexual Activity  . Alcohol use: Yes    Comment: occasional   . Drug use: Yes    Types: Marijuana  . Sexual  activity: Not on file  Other Topics Concern  . Not on file  Social History Narrative   Married, no children.   Educ: college degree.   Occup:   No tob.   Alc:   Drugs: marijuana   Social Determinants of Health   Financial Resource Strain:   . Difficulty of Paying Living Expenses: Not on file  Food Insecurity:   . Worried About Programme researcher, broadcasting/film/video in the Last Year: Not on file  . Ran Out of Food in the Last Year: Not on file  Transportation Needs:   . Lack of Transportation (Medical): Not on file  . Lack of Transportation (Non-Medical): Not on file  Physical Activity:   . Days of Exercise per Week: Not on file  . Minutes of Exercise per Session: Not on file  Stress:   . Feeling of Stress : Not on file  Social Connections:   . Frequency of  Communication with Friends and Family: Not on file  . Frequency of Social Gatherings with Friends and Family: Not on file  . Attends Religious Services: Not on file  . Active Member of Clubs or Organizations: Not on file  . Attends Banker Meetings: Not on file  . Marital Status: Not on file  Intimate Partner Violence:   . Fear of Current or Ex-Partner: Not on file  . Emotionally Abused: Not on file  . Physically Abused: Not on file  . Sexually Abused: Not on file    Outpatient Encounter Medications as of 01/21/2020  Medication Sig  . Loratadine-Pseudoephedrine (CLARITIN-D 24 HOUR PO) Take by mouth.  . [DISCONTINUED] diltiazem (CARDIZEM CD) 180 MG 24 hr capsule Take 1 capsule (180 mg total) by mouth daily.  Marland Kitchen diltiazem (CARTIA XT) 240 MG 24 hr capsule Take 1 capsule (240 mg total) by mouth daily.  . finasteride (PROPECIA) 1 MG tablet Take 1 tablet (1 mg total) by mouth daily.   Facility-Administered Encounter Medications as of 01/21/2020  Medication  . omalizumab Geoffry Paradise) injection 300 mg    No Known Allergies  ROS Review of Systems  Constitutional: Positive for fatigue. Negative for appetite change, chills and fever.  HENT: Negative for congestion, dental problem, ear pain and sore throat.   Eyes: Negative for discharge, redness and visual disturbance.  Respiratory: Positive for shortness of breath. Negative for cough, chest tightness and wheezing.   Cardiovascular: Negative for chest pain, palpitations and leg swelling.  Gastrointestinal: Negative for abdominal pain, blood in stool, diarrhea, nausea and vomiting.  Genitourinary: Negative for difficulty urinating, dysuria, flank pain, frequency, hematuria and urgency.  Musculoskeletal: Negative for arthralgias, back pain, joint swelling, myalgias and neck stiffness.  Skin: Positive for rash. Negative for pallor.  Neurological: Negative for dizziness, speech difficulty, weakness and headaches.  Hematological:  Negative for adenopathy. Does not bruise/bleed easily.  Psychiatric/Behavioral: Negative for confusion and sleep disturbance. The patient is not nervous/anxious.     PE; Blood pressure 130/87, pulse 79, temperature 98.6 F (37 C), temperature source Temporal, resp. rate 16, height 5\' 10"  (1.778 m), weight 245 lb 3.2 oz (111.2 kg), SpO2 96 %. Body mass index is 35.18 kg/m.  Gen: Alert, well appearing.  Patient is oriented to person, place, time, and situation. AFFECT: pleasant, lucid thought and speech. Mild male pattern hair loss noted. : no injection, icteris, swelling, or exudate.  EOMI, PERRLA. Mouth: lips without lesion/swelling.  Oral mucosa pink and moist. Oropharynx without erythema, exudate, or swelling.  CV: RRR, no m/r/g.  LUNGS: CTA bilat, nonlabored resps, good aeration in all lung fields. ABD: soft, NT/ND EXT: no clubbing or cyanosis.  no edema.  Skin: perinasal, perioral, and upper cheeks regions with faint pinkish rash that is very slightly raised.   Pertinent labs:  Lab Results  Component Value Date   TSH 3.56 11/15/2018   Lab Results  Component Value Date   WBC 9.7 01/17/2019   HGB 16.1 01/17/2019   HCT 45.1 01/17/2019   MCV 88.3 01/17/2019   PLT 393 01/17/2019   Lab Results  Component Value Date   CREATININE 1.34 (H) 01/17/2019   BUN 12 01/17/2019   NA 137 01/17/2019   K 4.1 01/17/2019   CL 98 01/17/2019   CO2 27 01/17/2019   Lab Results  Component Value Date   ALT 32 01/17/2019   AST 27 01/17/2019   ALKPHOS 60 01/17/2019   BILITOT 1.1 01/17/2019   Lab Results  Component Value Date   TESTOSTERONE 434 11/15/2018    ASSESSMENT AND PLAN:   New pt:  1) Tachycardia: minimally symptomatic, fortunately. Cardiology w/u unrevealing of anything remarkable. Will try inc in diltiazem extended release to 240mg  qd. Consider referral to local EP MD or autonomic clinic at [facility].  2) SOB, cough: past w/u in [facility]: PFTs  normal. Bronchodilator trial no help.  No hx of any imaging testing being done per pt. Will check CXR to start.  Pulm here in midst of eval, interrupted by covid pandemic. PFTs ordered but not set up, unclear when this will be scheduled in future or if pulm f/u to be done. If PFTs normal then pulm considering CT chest.  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 Recheck Testosterone panel today along with estradiol, LH, prolactin, and TSH level. Will refer to endocrinologist per pt request.  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 Non-fasting glucose today as well as Hba1c check today.  5) Facial skin rash: chronic, unknown cause.  Obs for now since this does not seem to be symptomatic and is not progressing/worsening.  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  An After Visit Summary was printed and given to the patient.  Return in about 4 weeks (around 02/18/2020) for f/u tachycardia.  Signed:  02/20/2020, MD           01/21/2020

## 2020-01-22 ENCOUNTER — Telehealth: Payer: Self-pay

## 2020-01-22 ENCOUNTER — Encounter: Payer: Self-pay | Admitting: Family Medicine

## 2020-01-22 LAB — TESTOSTERONE TOTAL,FREE,BIO, MALES
Albumin: 4.4 g/dL (ref 3.6–5.1)
Sex Hormone Binding: 23 nmol/L (ref 10–50)
Testosterone, Bioavailable: 110.3 ng/dL (ref >=110.0)
Testosterone, Free: 54.8 pg/mL (ref 46.0–224.0)
Testosterone: 322 ng/dL (ref 250–827)

## 2020-01-22 LAB — PROLACTIN: Prolactin: 8.4 ng/mL (ref 2.0–18.0)

## 2020-01-22 LAB — ESTRADIOL: Estradiol: 36 pg/mL (ref <=39)

## 2020-01-22 NOTE — Telephone Encounter (Signed)
Spoke with patient. Patient appointment rescheduled for Monday, 01/28/20 at 4pm.

## 2020-01-22 NOTE — Telephone Encounter (Signed)
Attempted to call patient to reschedule Xolair injection scheduled for 01/24/20. No answer. Left message.

## 2020-01-24 ENCOUNTER — Ambulatory Visit: Payer: BC Managed Care – PPO

## 2020-01-25 DIAGNOSIS — L501 Idiopathic urticaria: Secondary | ICD-10-CM | POA: Diagnosis not present

## 2020-01-28 ENCOUNTER — Ambulatory Visit (INDEPENDENT_AMBULATORY_CARE_PROVIDER_SITE_OTHER): Payer: BC Managed Care – PPO

## 2020-01-28 ENCOUNTER — Other Ambulatory Visit: Payer: Self-pay

## 2020-01-28 DIAGNOSIS — L501 Idiopathic urticaria: Secondary | ICD-10-CM

## 2020-02-12 ENCOUNTER — Other Ambulatory Visit: Payer: Self-pay

## 2020-02-12 ENCOUNTER — Emergency Department (HOSPITAL_COMMUNITY)
Admission: EM | Admit: 2020-02-12 | Discharge: 2020-02-12 | Disposition: A | Discharge status: Home / Self Care | Payer: BC Managed Care – PPO | Attending: Emergency Medicine | Admitting: Emergency Medicine

## 2020-02-12 DIAGNOSIS — Z79899 Other long term (current) drug therapy: Secondary | ICD-10-CM | POA: Insufficient documentation

## 2020-02-12 DIAGNOSIS — R000 Tachycardia, unspecified: Secondary | ICD-10-CM | POA: Diagnosis not present

## 2020-02-12 DIAGNOSIS — T7840XA Allergy, unspecified, initial encounter: Secondary | ICD-10-CM | POA: Insufficient documentation

## 2020-02-12 DIAGNOSIS — T783XXA Angioneurotic edema, initial encounter: Secondary | ICD-10-CM | POA: Diagnosis not present

## 2020-02-12 DIAGNOSIS — R21 Rash and other nonspecific skin eruption: Secondary | ICD-10-CM | POA: Diagnosis not present

## 2020-02-12 LAB — BASIC METABOLIC PANEL
Anion gap: 13 (ref 5–15)
BUN: 17 mg/dL (ref 6–20)
CO2: 24 mmol/L (ref 22–32)
Calcium: 8.9 mg/dL (ref 8.9–10.3)
Chloride: 102 mmol/L (ref 98–111)
Creatinine, Ser: 1.28 mg/dL — ABNORMAL HIGH (ref 0.61–1.24)
GFR calc Af Amer: >60 mL/min (ref >60)
GFR calc non Af Amer: >60 mL/min (ref >60)
Glucose, Bld: 109 mg/dL — ABNORMAL HIGH (ref 70–99)
Potassium: 3.4 mmol/L — ABNORMAL LOW (ref 3.5–5.1)
Sodium: 139 mmol/L (ref 135–145)

## 2020-02-12 LAB — CBC WITH DIFFERENTIAL/PLATELET
Abs Immature Granulocytes: 0.01 10*3/uL (ref 0.00–0.07)
Basophils Absolute: 0 10*3/uL (ref 0.0–0.1)
Basophils Relative: 0 %
Eosinophils Absolute: 0 10*3/uL (ref 0.0–0.5)
Eosinophils Relative: 0 %
HCT: 42.6 % (ref 39.0–52.0)
Hemoglobin: 14.9 g/dL (ref 13.0–17.0)
Immature Granulocytes: 0 %
Lymphocytes Relative: 16 %
Lymphs Abs: 1.2 10*3/uL (ref 0.7–4.0)
MCH: 31.2 pg (ref 26.0–34.0)
MCHC: 35 g/dL (ref 30.0–36.0)
MCV: 89.3 fL (ref 80.0–100.0)
Monocytes Absolute: 0.3 10*3/uL (ref 0.1–1.0)
Monocytes Relative: 4 %
Neutro Abs: 6 10*3/uL (ref 1.7–7.7)
Neutrophils Relative %: 80 %
Platelets: 358 10*3/uL (ref 150–400)
RBC: 4.77 MIL/uL (ref 4.22–5.81)
RDW: 13 % (ref 11.5–15.5)
WBC: 7.5 10*3/uL (ref 4.0–10.5)
nRBC: 0 % (ref 0.0–0.2)

## 2020-02-12 MED ORDER — METHYLPREDNISOLONE SODIUM SUCC 125 MG IJ SOLR
125.0000 mg | Freq: Once | INTRAMUSCULAR | Status: AC
  Administered 2020-02-12: 125 mg via INTRAVENOUS
  Filled 2020-02-12: qty 2

## 2020-02-12 MED ORDER — EPINEPHRINE 0.3 MG/0.3ML IJ SOAJ: 0.3000 mg | Freq: Once | INTRAMUSCULAR | 1 refills | Status: DC | PRN

## 2020-02-12 MED ORDER — ONDANSETRON 4 MG PO TBDP: 4.0000 mg | ORAL_TABLET | Freq: Three times a day (TID) | ORAL | 0 refills | Status: DC | PRN

## 2020-02-12 MED ORDER — FAMOTIDINE IN NACL 20-0.9 MG/50ML-% IV SOLN
20.0000 mg | INTRAVENOUS | Status: AC
  Administered 2020-02-12: 20 mg via INTRAVENOUS
  Filled 2020-02-12: qty 50

## 2020-02-12 MED ORDER — SODIUM CHLORIDE 0.9 % IV BOLUS
1000.0000 mL | Freq: Once | INTRAVENOUS | Status: AC
  Administered 2020-02-12: 1000 mL via INTRAVENOUS

## 2020-02-12 MED ORDER — ONDANSETRON HCL 4 MG/2ML IJ SOLN
4.0000 mg | Freq: Once | INTRAMUSCULAR | Status: AC
  Administered 2020-02-12: 4 mg via INTRAVENOUS
  Filled 2020-02-12: qty 2

## 2020-02-12 MED ORDER — PREDNISONE 20 MG PO TABS: ORAL_TABLET | ORAL | 0 refills | Status: DC

## 2020-02-12 NOTE — ED Provider Notes (Signed)
[facility] EMERGENCY DEPARTMENT Provider Note   CSN: [id number] Arrival date & time: 02/12/20  0010     History No chief complaint on file.   Peter Blankenship is a 34 y.o. male.  The history is provided by the patient and medical records.  Allergic Reaction Presenting symptoms: rash     34 year old male with history of eczema, chronic fatigue, chronic hives (idiopathic), chronic flushing of the face, presenting to the ED with allergic reaction. States earlier today he started to feel like he was having "strep throat", wife was recently diagnosed with this as well. States he decided to take one of her amoxicillin tablets this evening around 6 PM, was planning to go to the doctor in the morning. States later this evening he started to feel extremely flushed, itching and hives developing all over his skin. He did start to feel like his throat was closing so he decided to come in. He has had this happen before. He denies any known allergy to amoxicillin or other penicillin, states he has had formal allergy testing for this.  Denies sensation of tongue/lip swelling.  He is on xolair injections for his chronic hives, unsure if this is helping.  States he did take 50mg  benadryl PTA.  Past Medical History:  Diagnosis Date  . Angio-edema   . Chronic fatigue   . Diverticulosis   . DOE (dyspnea on exertion)    PFTs normal ([place]) per pt.  PFTs ordered here 07/2019, not completed as of 01/2020.  02/2020 Eczema   . Hypogonadism in male    hx of-> testing 01/2020 showed testost in lower normal range (on no med)  . Tachycardia    Dr. [place].  w/u NEG.  Dilt rx'd.  Anne Fu Urticaria     Patient Active Problem List   Diagnosis Date Noted  . Flushing 11/15/2018  . 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 Tachycardia 08/19/2018  . SOB (shortness of breath) 08/19/2018    Past Surgical History:  Procedure Laterality Date  . TRANSTHORACIC ECHOCARDIOGRAM   09/13/2018   EF 55-60%, grd I DD.  Valves normal.  . WISDOM TOOTH EXTRACTION Bilateral 2013       Family History  Problem Relation Age of Onset  . High Cholesterol Mother   . Testicular cancer Brother   . High Cholesterol Brother     Social History   Tobacco Use  . Smoking status: Never Smoker  . Smokeless tobacco: Never Used  Substance Use Topics  . Alcohol use: Yes    Comment: occasional   . Drug use: Yes    Types: Marijuana    Home Medications Prior to Admission medications   Medication Sig Start Date End Date Taking? Authorizing Provider  diltiazem (CARTIA XT) 240 MG 24 hr capsule Take 1 capsule (240 mg total) by mouth daily. 01/21/20   McGowen, 01/23/20, MD  finasteride (PROPECIA) 1 MG tablet Take 1 tablet (1 mg total) by mouth daily. 01/21/20   McGowen, 01/23/20, MD  Loratadine-Pseudoephedrine (CLARITIN-D 24 HOUR PO) Take by mouth.    [provider]    Allergies    Patient has no known allergies.  Review of Systems   Review of Systems  Skin: Positive for rash.  All other systems reviewed and are negative.   Physical Exam Updated Vital Signs There were no vitals taken for this visit.  Physical Exam Vitals and nursing note reviewed.  Constitutional:  Appearance: He is well-developed.  HENT:     Head: Normocephalic and atraumatic.     Comments: Flushing of the face, patient reports this is chronic and at baseline    Mouth/Throat:     Comments: No noted lip/tongue swelling, no oral lesions, airway widely patent, handling secretions well, no stridor, normal phonation Eyes:     Conjunctiva/sclera: Conjunctivae normal.     Pupils: Pupils are equal, round, and reactive to light.  Cardiovascular:     Rate and Rhythm: Regular rhythm. Tachycardia present.     Heart sounds: Normal heart sounds.  Pulmonary:     Effort: Pulmonary effort is normal.     Breath sounds: Normal breath sounds.  Abdominal:     General: Bowel sounds are normal.      Palpations: Abdomen is soft.  Musculoskeletal:        General: Normal range of motion.     Cervical back: Normal range of motion.  Skin:    General: Skin is warm and dry.     Comments: splotchiness noted to arms, neck, and torso  Neurological:     Mental Status: He is alert and oriented to person, place, and time.  Psychiatric:     Comments: Very anxious appearing     ED Results / Procedures / Treatments   Labs (all labs ordered are listed, but only abnormal results are displayed) Labs Reviewed - No data to display  EKG None  Radiology No results found.  Procedures Procedures (including critical care time)  CRITICAL CARE Performed by: Garlon Hatchet   Total critical care time: 45 minutes  Critical care time was exclusive of separately billable procedures and treating other patients.  Critical care was necessary to treat or prevent imminent or life-threatening deterioration.  Critical care was time spent personally by me on the following activities: development of treatment plan with patient and/or surrogate as well as nursing, discussions with consultants, evaluation of patient's response to treatment, examination of patient, obtaining history from patient or surrogate, ordering and performing treatments and interventions, ordering and review of laboratory studies, ordering and review of radiographic studies, pulse oximetry and re-evaluation of patient's condition.   Medications Ordered in ED Medications  famotidine (PEPCID) IVPB 20 mg premix (0 mg Intravenous Stopped 02/12/20 0304)  ondansetron (ZOFRAN) injection 4 mg (4 mg Intravenous Given 02/12/20 0056)  methylPREDNISolone sodium succinate (SOLU-MEDROL) 125 mg/2 mL injection 125 mg (125 mg Intravenous Given 02/12/20 0056)  sodium chloride 0.9 % bolus 1,000 mL (0 mLs Intravenous Stopped 02/12/20 0304)    ED Course  I have reviewed the triage vital signs and the nursing notes.  Pertinent labs & imaging results that were  available during my care of the patient were reviewed by me and considered in my medical decision making (see chart for details).    MDM Rules/Calculators/A&P    34 year old male presenting to the ED with allergic reaction.  He took a dose of his wife's amoxicillin, denies any known history of allergy to penicillin in the past.  Complains of sensation of throat closing.  He did take 50 mg of Benadryl prior to arrival.  He is awake, alert, appropriately oriented.  He is able to speak in full sentences, handing secretions well, normal phonation without stridor.  Does have some flushing of the skin, however has a chronic issue with this and feels it is at baseline.  He does report a "tight" sensation in his hands and face.  During exam, patient was given  dose of epinephrine in right thigh, however this was not held to the skin for 10 seconds afterwards I am unsure exactly how much of this medication he received.  He is not displaying any signs of true anaphylaxis so we will not repeat this.  Will give dose of IV Solu-Medrol and Pepcid along with IV fluids.  Will monitor closely.  6:13 AM Patient has been observed here for several hours.  States he is feeling better.  No difficulty swallowing at present, has been tolerating oral fluids here without issue.  Remains with some tachycardia, however this is a chronic issue for him and has been worked up by cardiology for this in the past.  At this point, feel he is stable for discharge home.  Will discharge with prednisone taper and prescription for EpiPen.  We have discussed indications for use and need for emergent evaluation afterwards refused.  Can continue benadryl PRN as well.  He will arrange follow-up with his allergist.  He will return here for any new or acute changes.   Final Clinical Impression(s) / ED Diagnoses Final diagnoses:  Allergic reaction, initial encounter    Rx / DC Orders ED Discharge Orders         Ordered    predniSONE (DELTASONE)  20 MG tablet     02/12/20 0617    EPINEPHrine (EPIPEN 2-PAK) 0.3 mg/0.3 mL IJ SOAJ injection  Once PRN     02/12/20 0617    ondansetron (ZOFRAN ODT) 4 MG disintegrating tablet  Every 8 hours PRN     02/12/20 0617           Larene Pickett, PA-C 02/12/20 Avery, Delice Bison, DO 02/12/20 [phone]

## 2020-02-12 NOTE — Discharge Instructions (Signed)
Medications have been sent to your pharmacy.  Take as directed. Use EpiPen only for severe reaction including sensation of throat closing, difficulty swallowing, shortness of breath, or severe lip or tongue swelling.  If used, you will need to come to the ER for evaluation. I would recommend to follow-up with your primary care doctor and/or your allergist. Return here for any new or acute changes.

## 2020-02-12 NOTE — ED Triage Notes (Signed)
Pt. stated, he took Amoxicillian for Strep Throat that was prescribed for his wife and thinks he had a reaction to it. Pt. stated, he took 2 benedryl PTA.

## 2020-02-14 ENCOUNTER — Encounter: Payer: Self-pay | Admitting: Allergy & Immunology

## 2020-02-14 ENCOUNTER — Other Ambulatory Visit: Payer: Self-pay

## 2020-02-14 ENCOUNTER — Ambulatory Visit (INDEPENDENT_AMBULATORY_CARE_PROVIDER_SITE_OTHER): Payer: BC Managed Care – PPO | Admitting: Allergy & Immunology

## 2020-02-14 VITALS — BP 124/94 | HR 76 | Temp 97.5°F | Resp 20

## 2020-02-14 DIAGNOSIS — L501 Idiopathic urticaria: Secondary | ICD-10-CM | POA: Diagnosis not present

## 2020-02-14 DIAGNOSIS — B370 Candidal stomatitis: Secondary | ICD-10-CM | POA: Diagnosis not present

## 2020-02-14 NOTE — Progress Notes (Signed)
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 Donato Schultz)  Shortness of breath- no improvement with Breo  Amoxicillin allergy   Adverse reaction to prednisone - ? known side effect  Oral candidiasis   Plan/Recommendations:   1. 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 - We ordered a serum tryptase to see if this is elevated during this reaction.  - Add on cetirizine 20mg  twice daily for one week (hits the H1 receptors) - Add on fexofenadine in the middle of the day. - Add on famotidine 40mg  twice daily for one week (hits the H2 receptors)  - Add on compounded triamcinolone with Eucerin (script for a tub sent in) three times daily.   2. Oral thrush - Start nystatin swish and spit four times daily for one week. - This is likely related to your steroids that you received.   3. Return in about 6 months (around 08/16/2020).   Subjective:   Peter Blankenship is a 34 y.o. male presenting today for follow up of  Chief Complaint  Patient presents with  . Allergic Reaction    recent hospital stay     Peter Blankenship has a history of the following: Patient Active Problem List   Diagnosis Date Noted  . Flushing 11/15/2018  . 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 Tachycardia 08/19/2018  . SOB (shortness of breath) 08/19/2018    History obtained from: chart review and patient.  Peter Blankenship is a 34 y.o. male presenting for a sick visit. She was last seen in January 2021. At that time, he decided to start Xolair as a means of immunomodulation.   He started having problems on Monday night. His wife was diagnosed with Strep throat. She had a swab and was positive. He had a "bit of a fever" and took an amoxicillin that night. He took the dose around 7pm. Then around 11:30 pm he started feeling worse with more throat  pain. Then around midnight or so, he started having problems with throat closure sensation. He felt the tongue and the throat closing up. He was having shaking and went to the hospital.   He then went to the hospital and started having vomiting and "causing a scene" and was shaking. He was given an EpiPen in his thigh and then he was placed on steroids as well as Zofran and fluids.   He placed on IV steroids in the hospital and was sent home with a prescription. He did not start the steroids at all.  The only dose of steroids he received were the IV once in the emergency room.  He thinks that this is distinct from his normal allergic reactions.  His normal allergic reactions are much more pruritic.  They are also characterized by less confluent redness and more patchy redness.  During this current episode, his heart rate has been stable in the low 100s.  He continues to have some shortness of breath that is not responsive to albuterol.  He stopped his combined ICS/LABA years ago.  It never seemed to work at all.  He has not been to see Pulmonology.  He has take multiple courses of amoxicillin without an issue. He did have a problem when he took amoxicillin in his 81s. He did have testing that was negative and then had a course or two after. He is on loratadine and  Benadryl. He is under the impression that we did "some blood work" to rule out an amoxicillin allergy. However, I assured him that we had not. Regardless, he seems to have tolerated amoxicllin on and off over the years. He was also tested at some point for amoxicillin allergy, as he does continue to reference that during the visit today.   Otherwise, there have been no changes to his past medical history, surgical history, family history, or social history.    Review of Systems  Constitutional: Negative.  Negative for chills, fever, malaise/fatigue and weight loss.  HENT: Negative.  Negative for congestion, ear discharge, ear pain, sinus  pain and sore throat.   Eyes: Negative for pain, discharge and redness.  Respiratory: Negative for cough, sputum production, shortness of breath and wheezing.   Cardiovascular: Negative.  Negative for chest pain and palpitations.  Gastrointestinal: Negative for abdominal pain, constipation, diarrhea, heartburn, nausea and vomiting.  Skin: Positive for itching and rash.  Neurological: Negative for dizziness and headaches.  Endo/Heme/Allergies: Negative for environmental allergies. Does not bruise/bleed easily.       Objective:   Blood pressure (!) 124/94, pulse 76, temperature (!) 97.5 F (36.4 C), temperature source Temporal, resp. rate 20, SpO2 98 %. There is no height or weight on file to calculate BMI.   Physical Exam:  Physical Exam  Constitutional: He appears well-developed.  HENT:  Head: Normocephalic and atraumatic.  Right Ear: Tympanic membrane, external ear and ear canal normal.  Left Ear: Tympanic membrane, external ear and ear canal normal.  Nose: No mucosal edema, rhinorrhea, nasal deformity or septal deviation. No epistaxis. Right sinus exhibits no maxillary sinus tenderness and no frontal sinus tenderness. Left sinus exhibits no maxillary sinus tenderness and no frontal sinus tenderness.  Mouth/Throat: Uvula is midline and oropharynx is clear and moist. Mucous membranes are not pale and not dry.  Oropharynx clear.  No oropharyngeal edema appreciated. There are some white lesions on the tongue that are painful to touch with the tongue blade.  Eyes: Pupils are equal, round, and reactive to light. Conjunctivae and EOM are normal. Right eye exhibits no chemosis and no discharge. Left eye exhibits no chemosis and no discharge. Right conjunctiva is not injected. Left conjunctiva is not injected.  Cardiovascular: Normal rate, regular rhythm and normal heart sounds.  Respiratory: Effort normal and breath sounds normal. No accessory muscle usage. No tachypnea. No respiratory  distress. He has no wheezes. He has no rhonchi. He has no rales. He exhibits no tenderness.  Moving air well in all lung fields.  No increased work of breathing.  Lymphadenopathy:    He has no cervical adenopathy.  Neurological: He is alert.  Skin: No abrasion, no petechiae and no rash noted. Rash is not papular, not vesicular and not urticarial. No erythema. No pallor.  Movement erythematous macules over the bilateral upper hands and upper chest.  He also has some extending onto his lower abdomen as well as on his upper back.  His face is rather erythematous as well.  Psychiatric: He has a normal mood and affect.     Diagnostic studies: serum tryptase collected and sent    Salvatore Marvel, MD  [facility] [place] [facility]

## 2020-02-14 NOTE — Patient Instructions (Addendum)
1. 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 - We ordered a serum tryptase to see if this is elevated during this reaction.  - Add on cetirizine 20mg  twice daily for one week (hits the H1 receptors) - Add on fexofenadine in the middle of the day. - Add on famotidine 40mg  twice daily for one week (hits the H2 receptors)  - Add on compounded triamcinolone with Eucerin (script for a tub sent in) three times daily.   2. Oral thrush - Start nystatin swish and spit four times daily for one week. - This is likely related to your steroids that you received.   3. Return in about 6 months (around 08/16/2020).   Please inform of any Emergency Department visits, hospitalizations, or changes in symptoms. Call 10/16/2020 before going to the ED for breathing or allergy symptoms since we might be able to fit you in for a sick visit. Feel free to contact us anytime with any questions, problems, or concerns.  It was a pleasure to see you again today!  Websites that have reliable patient information: 1. American Academy of Asthma, Allergy, and Immunology: www.aaaai.org 2. Food Allergy Research and Education (FARE): foodallergy.org 3. Mothers of Asthmatics: http://www.asthmacommunitynetwork.org 4. American College of Allergy, Asthma, and Immunology: [place]   Make sure you are registered to vote! If you have moved or changed any of your contact information, you will need to get this updated before voting!

## 2020-02-15 MED ORDER — CETIRIZINE HCL 10 MG PO TABS: ORAL_TABLET | ORAL | 5 refills | Status: DC

## 2020-02-15 MED ORDER — FAMOTIDINE 10 MG PO TABS: 40.0000 mg | ORAL_TABLET | Freq: Two times a day (BID) | ORAL | 0 refills | Status: DC

## 2020-02-15 MED ORDER — NYSTATIN 100000 UNIT/ML MT SUSP: 5.0000 mL | Freq: Four times a day (QID) | OROMUCOSAL | 0 refills | Status: AC

## 2020-02-15 MED ORDER — TRIAMCINOLONE ACETONIDE 0.5 % EX OINT: 1.0000 "application " | TOPICAL_OINTMENT | Freq: Two times a day (BID) | CUTANEOUS | 1 refills | Status: DC

## 2020-02-16 ENCOUNTER — Encounter: Payer: Self-pay | Admitting: Allergy & Immunology

## 2020-02-16 LAB — TRYPTASE: Tryptase: 2.1 ug/L — ABNORMAL LOW (ref 2.2–13.2)

## 2020-02-18 ENCOUNTER — Other Ambulatory Visit: Payer: Self-pay

## 2020-02-18 ENCOUNTER — Ambulatory Visit (INDEPENDENT_AMBULATORY_CARE_PROVIDER_SITE_OTHER): Payer: BC Managed Care – PPO | Admitting: Family Medicine

## 2020-02-18 ENCOUNTER — Encounter: Payer: Self-pay | Admitting: Family Medicine

## 2020-02-18 VITALS — BP 114/79 | HR 90 | Temp 98.2°F | Resp 16 | Ht 70.0 in | Wt 239.0 lb

## 2020-02-18 DIAGNOSIS — Z8619 Personal history of other infectious and parasitic diseases: Secondary | ICD-10-CM

## 2020-02-18 DIAGNOSIS — R0602 Shortness of breath: Secondary | ICD-10-CM | POA: Diagnosis not present

## 2020-02-18 DIAGNOSIS — R000 Tachycardia, unspecified: Secondary | ICD-10-CM

## 2020-02-18 NOTE — Progress Notes (Signed)
OFFICE VISIT  03/04/2020   CC: f/u tachycardia  HPI:    Patient is a 34 y.o. Caucasian male who presents for 1 mo f/u his long history of tachy-palpitations of undetermined etiology despite thorough cardiology w/u. A/P as of last visit: "1) Tachycardia: minimally symptomatic, fortunately. Cardiology w/u unrevealing of anything remarkable. Will try inc in diltiazem extended release to 240mg  qd. Consider referral to local EP MD or autonomic clinic at [facility].  2) SOB, cough: past w/u in [place]: PFTs normal. Bronchodilator trial no help.  No hx of any imaging testing being done per pt. Will check CXR to start.  Pulm here in midst of eval, interrupted by covid pandemic. PFTs ordered but not set up, unclear when this will be scheduled in future or if pulm f/u to be done. If PFTs normal then pulm considering CT chest.  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 Recheck Testosterone panel today along with estradiol, LH, prolactin, and TSH level. Will refer to endocrinologist per pt request.  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 Non-fasting glucose today as well as Hba1c check today.  5) Facial skin rash: chronic, unknown cause.  Obs for now since this does not seem to be symptomatic and is not progressing/worsening.  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 I did some endo/testost labs and referred him to endocrinologist at [place] visit per his request --this appt is set for June. Since inc in cardizem his HR is lower, now in the 90s more consistently.  Initially a bit of mental fog but this is letting up gradually.  Feeling fine otherwise: no palpitations, racing heart, or CP.  No change in DOE. He has not exercised yet. No problems with starting propecia. He has been searching the web,  wonders if his mono may have caused his tachycardia and the SOB issues he has been feeling, even wonders if he still may have "chronic" mono. He really wants blood testing to look for this.   Past Medical History:  Diagnosis Date  . Angio-edema    amoxil rxn  . Chronic fatigue   . Diverticulosis   . DOE (dyspnea on exertion)    PFTs normal ([place]) per pt.  PFTs ordered here 07/2019, not completed as of 01/2020.  Marland Kitchen Eczema   . Hypogonadism in male    hx of-> testing 01/2020 showed testost in lower normal range (on no med)  . Mononucleosis age 32   hospitalized x 1 wk  . Tachycardia    Dr. Marlou Porch[place]- [place] arrhythmia.  w/u NEG.  Dilt rx'd.  Marland Kitchen Urticaria     Past Surgical History:  Procedure Laterality Date  . TRANSTHORACIC ECHOCARDIOGRAM  09/13/2018   EF 55-60%, grd I DD.  Valves normal.  . WISDOM TOOTH EXTRACTION Bilateral 2013    Outpatient Medications Prior to Visit  Medication Sig Dispense Refill  . cetirizine (ZYRTEC) 10 MG tablet Take two tablet to equal 20 mg twice a day for 7 days. 30 tablet 5  . diltiazem (CARTIA XT) 240 MG 24 hr capsule Take 1 capsule (240 mg total) by mouth daily. 30 capsule 1  . EPINEPHrine (EPIPEN 2-PAK) 0.3 mg/0.3 mL IJ SOAJ injection Inject 0.3 mLs (0.3 mg total) into the muscle once as needed for up to 1 dose (for severe allergic reaction). 1 each 1  .  famotidine (PEPCID) 10 MG tablet Take 4 tablets (40 mg total) by mouth 2 (two) times daily for 7 days. 30 tablet 0  . finasteride (PROPECIA) 1 MG tablet Take 1 tablet (1 mg total) by mouth daily. 90 tablet 3  . omalizumab (XOLAIR) 150 MG/ML prefilled syringe Inject 300 mg into the skin every 28 (twenty-eight) days.    Marland Kitchen nystatin (MYCOSTATIN) 100000 UNIT/ML suspension Take 5 mLs (500,000 Units total) by mouth 4 (four) times daily for 7 days. (Patient not taking: Reported on 02/18/2020) 437 mL 0  . ondansetron (ZOFRAN ODT) 4 MG disintegrating tablet Take 1 tablet (4 mg total) by mouth  every 8 (eight) hours as needed for nausea. (Patient not taking: Reported on 02/18/2020) 10 tablet 0  . triamcinolone ointment (KENALOG) 0.5 % Apply 1 application topically 2 (two) times daily. (Patient not taking: Reported on 02/18/2020) 450 g 1  . predniSONE (DELTASONE) 20 MG tablet Take 40 mg by mouth daily for 3 days, then 20mg  by mouth daily for 3 days, then 10mg  daily for 3 days (Patient not taking: Reported on 02/14/2020) 12 tablet 0   Facility-Administered Medications Prior to Visit  Medication Dose Route Frequency Provider Last Rate Last Admin  . omalizumab ) injection 300 mg  300 mg Subcutaneous Q28 days 04/15/2020, MD   300 mg at 02/25/20 1618    Allergies  Allergen Reactions  . Amoxicillin Rash    ROS As per HPI  PE: Blood pressure 114/79, pulse 90, temperature 98.2 F (36.8 C), temperature source Temporal, resp. rate 16, height 5\' 10"  (1.778 m), weight 239 lb (108.4 kg), SpO2 96 %. Body mass index is 34.29 kg/m.  Gen: Alert, well appearing.  Patient is oriented to person, place, time, and situation. AFFECT: pleasant, lucid thought and speech. No further exam today.  LABS:  Lab Results  Component Value Date   TSH 2.66 01/21/2020   Lab Results  Component Value Date   WBC 7.5 02/12/2020   HGB 14.9 02/12/2020   HCT 42.6 02/12/2020   MCV 89.3 02/12/2020   PLT 358 02/12/2020   Lab Results  Component Value Date   CREATININE 1.28 (H) 02/12/2020   BUN 17 02/12/2020   NA 139 02/12/2020   K 3.4 (L) 02/12/2020   CL 102 02/12/2020   CO2 24 02/12/2020   Lab Results  Component Value Date   ALT 32 01/21/2020   AST 24 01/21/2020   ALKPHOS 73 01/21/2020   BILITOT 0.5 01/21/2020   Lab Results  Component Value Date   CHOL 246 (H) 01/21/2020   Lab Results  Component Value Date   HDL 47.00 01/21/2020   No results found for: [facility] Lab Results  Component Value Date   TRIG 324.0 (H) 01/21/2020   Lab Results  Component Value Date   CHOLHDL 5  01/21/2020   Lab Results  Component Value Date   TESTOSTERONE 322 01/21/2020   Lab Results  Component Value Date   ESRSEDRATE 10 01/21/2020   LH 4.07. FSH and estrogen wnl for male.  Lab Results  Component Value Date   HGBA1C 5.1 01/21/2020    IMPRESSION AND PLAN:  1) Tachycardia, improving a little with increased dose of diltiazem.  Continue 240mg  ER dose qd.  2) Hx of SOB/cough of unknown etiology: encouraged him to get the CXR I ordered last visit. PFTs still need to be scheduled.  3) Hx of EBV approx 12 yrs ago:  Pt really wants to r/o any abnl blood  test that may suggest he still is having some effect from this.  I told him chronic EBV does not happen. EBV labs today.  An After Visit Summary was printed and given to the patient.  FOLLOW UP: Return in about 4 weeks (around 03/17/2020) for f/u tachycardia.  Signed:  Santiago Bumpers, MD           03/04/2020

## 2020-02-19 LAB — EPSTEIN-BARR VIRUS VCA ANTIBODY PANEL
EBV NA IgG: 43.6 U/mL — ABNORMAL HIGH
EBV VCA IgG: 109 U/mL — ABNORMAL HIGH
EBV VCA IgM: <36 U/mL

## 2020-02-19 LAB — EPSTEIN-BARR VIRUS EARLY D ANTIGEN ANTIBODY, IGG: EBV EA IgG: <9 U/mL

## 2020-02-22 DIAGNOSIS — L501 Idiopathic urticaria: Secondary | ICD-10-CM | POA: Diagnosis not present

## 2020-02-25 ENCOUNTER — Other Ambulatory Visit: Payer: Self-pay

## 2020-02-25 ENCOUNTER — Ambulatory Visit (INDEPENDENT_AMBULATORY_CARE_PROVIDER_SITE_OTHER): Payer: BC Managed Care – PPO

## 2020-02-25 DIAGNOSIS — L501 Idiopathic urticaria: Secondary | ICD-10-CM | POA: Diagnosis not present

## 2020-03-10 ENCOUNTER — Ambulatory Visit
Admission: RE | Admit: 2020-03-10 | Discharge: 2020-03-10 | Disposition: A | Discharge status: Home / Self Care | Payer: BC Managed Care – PPO | Source: Ambulatory Visit | Attending: Family Medicine | Admitting: Family Medicine

## 2020-03-10 ENCOUNTER — Other Ambulatory Visit: Payer: Self-pay

## 2020-03-10 DIAGNOSIS — R0602 Shortness of breath: Secondary | ICD-10-CM | POA: Diagnosis not present

## 2020-03-10 DIAGNOSIS — R05 Cough: Secondary | ICD-10-CM | POA: Diagnosis not present

## 2020-03-10 DIAGNOSIS — R059 Cough, unspecified: Secondary | ICD-10-CM

## 2020-03-11 ENCOUNTER — Encounter: Payer: Self-pay | Admitting: Family Medicine

## 2020-03-21 DIAGNOSIS — L501 Idiopathic urticaria: Secondary | ICD-10-CM

## 2020-03-24 ENCOUNTER — Ambulatory Visit (INDEPENDENT_AMBULATORY_CARE_PROVIDER_SITE_OTHER): Payer: BC Managed Care – PPO

## 2020-03-24 ENCOUNTER — Other Ambulatory Visit: Payer: Self-pay

## 2020-03-24 DIAGNOSIS — L501 Idiopathic urticaria: Secondary | ICD-10-CM | POA: Diagnosis not present

## 2020-04-03 ENCOUNTER — Ambulatory Visit: Payer: BC Managed Care – PPO | Admitting: Allergy & Immunology

## 2020-04-09 ENCOUNTER — Other Ambulatory Visit: Payer: Self-pay | Admitting: Family Medicine

## 2020-04-09 NOTE — Telephone Encounter (Signed)
Patient instructed to follow up in the office around 03/17/20.  LMOM for pt to CB to schedule appointment and we can send in short term supply to pharmacy.

## 2020-04-09 NOTE — Telephone Encounter (Signed)
Pt returned call and advised due for next f/u. Appt scheduled, 30 d supply sent. Pt aware 30 d supply sent and next refill qty will depend on when PCP states to return after 5/10 appt.

## 2020-04-14 ENCOUNTER — Ambulatory Visit: Payer: BC Managed Care – PPO | Admitting: Family Medicine

## 2020-04-18 DIAGNOSIS — L501 Idiopathic urticaria: Secondary | ICD-10-CM | POA: Diagnosis not present

## 2020-04-21 ENCOUNTER — Ambulatory Visit (INDEPENDENT_AMBULATORY_CARE_PROVIDER_SITE_OTHER): Payer: BC Managed Care – PPO

## 2020-04-21 ENCOUNTER — Other Ambulatory Visit: Payer: Self-pay

## 2020-04-21 DIAGNOSIS — L501 Idiopathic urticaria: Secondary | ICD-10-CM

## 2020-04-28 ENCOUNTER — Ambulatory Visit (INDEPENDENT_AMBULATORY_CARE_PROVIDER_SITE_OTHER): Payer: BC Managed Care – PPO | Admitting: Family Medicine

## 2020-04-28 ENCOUNTER — Other Ambulatory Visit: Payer: Self-pay

## 2020-04-28 ENCOUNTER — Encounter: Payer: Self-pay | Admitting: Family Medicine

## 2020-04-28 VITALS — BP 123/80 | HR 74 | Temp 98.1°F | Resp 16 | Ht 70.0 in | Wt 245.6 lb

## 2020-04-28 DIAGNOSIS — R05 Cough: Secondary | ICD-10-CM

## 2020-04-28 DIAGNOSIS — R053 Chronic cough: Secondary | ICD-10-CM

## 2020-04-28 DIAGNOSIS — J309 Allergic rhinitis, unspecified: Secondary | ICD-10-CM | POA: Diagnosis not present

## 2020-04-28 DIAGNOSIS — R000 Tachycardia, unspecified: Secondary | ICD-10-CM

## 2020-04-28 DIAGNOSIS — R059 Cough, unspecified: Secondary | ICD-10-CM

## 2020-04-28 DIAGNOSIS — J45909 Unspecified asthma, uncomplicated: Secondary | ICD-10-CM

## 2020-04-28 MED ORDER — FLUTICASONE-SALMETEROL 250-50 MCG/DOSE IN AEPB: 1.0000 | INHALATION_SPRAY | Freq: Two times a day (BID) | RESPIRATORY_TRACT | 1 refills | Status: DC

## 2020-04-28 MED ORDER — FLUTICASONE PROPIONATE 50 MCG/ACT NA SUSP: 2.0000 | Freq: Every day | NASAL | 6 refills | Status: DC

## 2020-04-28 MED ORDER — ALBUTEROL SULFATE HFA 108 (90 BASE) MCG/ACT IN AERS: 2.0000 | INHALATION_SPRAY | Freq: Four times a day (QID) | RESPIRATORY_TRACT | 0 refills | Status: DC | PRN

## 2020-04-28 NOTE — Progress Notes (Signed)
CC:  Peter Blankenship is a 34 yo man with a PMH of tachycardia, SOB, and hypogonadism who presents for a 2 month follow up regarding his tachycardia.   HPI:  Tolerating meds? Yes   A/P of last visit: "1) Tachycardia, improving a little with increased dose of diltiazem.  Continue 240mg  ER dose qd.  2) Hx of SOB/cough of unknown etiology: encouraged him to get the CXR I ordered last visit. PFTs still need to be scheduled.  3) Hx of EBV approx 12 yrs ago:  Pt really wants to r/o any abnl blood test that may suggest he still is having some effect from this.  I told him chronic EBV does not happen. EBV labs today."   INTERIM HX: W/ Diltiazem -> RHR in 90s usually  BP at office: 123/80  Has had persistent cough for years: mostly mornings, feels like can't get a good breath until he coughs for a while.  He can't specify whether he feels difficulty getting air in, air out, or both.  Denies that he feels/hears any wheezing.  He says he feels like it could even be psychosomatic.  No fevers. -took pseudophed daily for a period of time and it went away ; drainage? Should I take every day? - probably ok  -worst in AM  -no blood  -air purifier, humidifier did not help  -zyrtec or claritin and famotidine control seasonal allergies  -no fever, sore throat - cough drops help   No CP, dizziness, palpitations, or LE edema.    PMH: Past Medical History:  Diagnosis Date  . Angio-edema    amoxil rxn  . Chronic fatigue   . Diverticulosis   . DOE (dyspnea on exertion)    PFTs normal ([place]) per pt.  PFTs ordered here 07/2019, not completed as of 01/2020. CXR normal here 03/2020.  Marland Kitchen Eczema   . Hypogonadism in male    hx of-> testing 01/2020 showed testost in lower normal range (on no med)  . Mononucleosis age 31   hospitalized x 1 wk  . Tachycardia    Dr. Marlou Porch[place]- no arrhythmia.  w/u NEG.  Dilt rx'd.  Marland Kitchen Urticaria     M/A: Current Outpatient Medications on File Prior  to Visit  Medication Sig Dispense Refill  . cetirizine (ZYRTEC) 10 MG tablet Take two tablet to equal 20 mg twice a day for 7 days. 30 tablet 5  . diltiazem (CARDIZEM CD) 240 MG 24 hr capsule TAKE ONE CAPSULE BY MOUTH DAILY 30 capsule 0  . EPINEPHrine (EPIPEN 2-PAK) 0.3 mg/0.3 mL IJ SOAJ injection Inject 0.3 mLs (0.3 mg total) into the muscle once as needed for up to 1 dose (for severe allergic reaction). 1 each 1  . famotidine (PEPCID) 10 MG tablet Take 4 tablets (40 mg total) by mouth 2 (two) times daily for 7 days. 30 tablet 0  . finasteride (PROPECIA) 1 MG tablet Take 1 tablet (1 mg total) by mouth daily. 90 tablet 3  . omalizumab (XOLAIR) 150 MG/ML prefilled syringe Inject 300 mg into the skin every 28 (twenty-eight) days.    . ondansetron (ZOFRAN ODT) 4 MG disintegrating tablet Take 1 tablet (4 mg total) by mouth every 8 (eight) hours as needed for nausea. (Patient not taking: Reported on 02/18/2020) 10 tablet 0  . triamcinolone ointment (KENALOG) 0.5 % Apply 1 application topically 2 (two) times daily. (Patient not taking: Reported on 02/18/2020) 450 g 1   Current Facility-Administered Medications on File  Prior to Visit  Medication Dose Route Frequency Provider Last Rate Last Admin  . omalizumab Geoffry Paradise) injection 300 mg  300 mg Subcutaneous Q28 days Alfonse Spruce, MD   300 mg at 04/21/20 1605   Allergies  Allergen Reactions  . Amoxicillin Rash    FH: Family History  Problem Relation Age of Onset  . High Cholesterol Mother   . Testicular cancer Brother   . High Cholesterol Brother     SH: Social History   Socioeconomic History  . Marital status: Married    Spouse name: Not on file  . Number of children: Not on file  . Years of education: Not on file  . Highest education level: Not on file  Occupational History  . Not on file  Tobacco Use  . Smoking status: Never Smoker  . Smokeless tobacco: Never Used  Substance and Sexual Activity  . Alcohol use: Yes     Comment: occasional   . Drug use: Yes    Types: Marijuana  . Sexual activity: Not on file  Other Topics Concern  . Not on file  Social History Narrative   Married, no children.   Educ: college degree.   Occup:   No tob.   Alc:   Drugs: marijuana   Social Determinants of Health   Financial Resource Strain:   . Difficulty of Paying Living Expenses:   Food Insecurity:   . Worried About Programme researcher, broadcasting/film/video in the Last Year:   . Barista in the Last Year:   Transportation Needs:   . Freight forwarder (Medical):   Marland Kitchen Lack of Transportation (Non-Medical):   Physical Activity:   . Days of Exercise per Week:   . Minutes of Exercise per Session:   Stress:   . Feeling of Stress :   Social Connections:   . Frequency of Communication with Friends and Family:   . Frequency of Social Gatherings with Friends and Family:   . Attends Religious Services:   . Active Member of Clubs or Organizations:   . Attends Banker Meetings:   Marland Kitchen Marital Status:     ROS: ROS  PE: Vitals with BMI 02/18/2020 02/14/2020 02/12/2020  Height 5\' 10"  - -  Weight 239 lbs - -  BMI 34.29 - -  Systolic 114 124 93  Diastolic 79 94 58  Pulse 90 76 110    Physical Exam Gen: Alert, well appearing.  Patient is oriented to person, place, time, and situation. AFFECT: pleasant, lucid thought and speech. CV: RRR, no m/r/g.   LUNGS: CTA bilat, nonlabored resps, good aeration in all lung fields. EXT: no clubbing or cyanosis.  no edema.   Labs: Recent Results (from the past 2160 hour(s))  CBC with Differential     Status: None   Collection Time: 02/12/20  1:15 AM  Result Value Ref Range   WBC 7.5 4.0 - 10.5 K/uL   RBC 4.77 4.22 - 5.81 MIL/uL   Hemoglobin 14.9 13.0 - 17.0 g/dL   HCT 04/13/20 34.1 - 96.2 %   MCV 89.3 80.0 - 100.0 fL   MCH 31.2 26.0 - 34.0 pg   MCHC 35.0 30.0 - 36.0 g/dL   RDW 22.9 79.8 - 92.1 %   Platelets 358 150 - 400 K/uL   nRBC 0.0 0.0 - 0.2 %   Neutrophils Relative %  80 %   Neutro Abs 6.0 1.7 - 7.7 K/uL   Lymphocytes Relative 16 %  Lymphs Abs 1.2 0.7 - 4.0 K/uL   Monocytes Relative 4 %   Monocytes Absolute 0.3 0.1 - 1.0 K/uL   Eosinophils Relative 0 %   Eosinophils Absolute 0.0 0.0 - 0.5 K/uL   Basophils Relative 0 %   Basophils Absolute 0.0 0.0 - 0.1 K/uL   Immature Granulocytes 0 %   Abs Immature Granulocytes 0.01 0.00 - 0.07 K/uL    Comment: Performed at [facility], [address] [place], [place] [address]  Basic metabolic panel     Status: Abnormal   Collection Time: 02/12/20  1:15 AM  Result Value Ref Range   Sodium 139 135 - 145 mmol/L   Potassium 3.4 (L) 3.5 - 5.1 mmol/L   Chloride 102 98 - 111 mmol/L   CO2 24 22 - 32 mmol/L   Glucose, Bld 109 (H) 70 - 99 mg/dL    Comment: Glucose reference range applies only to samples taken after fasting for at least 8 hours.   BUN 17 6 - 20 mg/dL   Creatinine, Ser 9.76 (H) 0.61 - 1.24 mg/dL   Calcium 8.9 8.9 - 73.4 mg/dL   GFR calc non Af Amer >60 >60 mL/min   GFR calc Af Amer >60 >60 mL/min   Anion gap 13 5 - 15    Comment: Performed at [facility], [address], [place], [place] [address]  Tryptase     Status: Abnormal   Collection Time: 02/14/20  2:05 PM  Result Value Ref Range   Tryptase 2.1 (L) 2.2 - 13.2 ug/L  Epstein-Barr virus VCA antibody panel     Status: Abnormal   Collection Time: 02/18/20 11:04 AM  Result Value Ref Range   EBV VCA IgM <36.00 U/mL    Comment:       U/mL              Interpretation       ----              --------------       <36.00            Negative       36.00-43.99       Equivocal       >43.99            Positive    EBV VCA IgG 109.00 (H) U/mL    Comment:        U/mL             Interpretation        ----             --------------        <18.00           Negative        18.00-21.99      Equivocal        >21.99           Positive    EBV NA IgG 43.60 (H) U/mL    Comment:        U/mL             Interpretation        ----              --------------        <18.00           Negative        18.00-21.99      Equivocal        >  21.99           Positive    Interpretation      Comment: . Suggestive of a past Epstein-Barr virus infection. In infants, a similar pattern may occur as a result of passive maternal transfer of antibody. Marland Kitchen   Epstein-Barr virus early D antigen antibody, IgG     Status: None   Collection Time: 02/18/20 11:04 AM  Result Value Ref Range   EBV EA IgG <9.00 U/mL    Comment:        U/mL             Interpretation        ----             --------------        <9.00            Negative        9.00-10.99       Equivocal        >10.99           Positive      A/P: In summary, Peter Blankenship is a 34 year old man with a past medical history of tachycardia, hypogonadism, eczema who presents for follow up regarding his tachycardia. On physical exam, PE. We reviewed age and gender appropriate health maintenance issues (prudent diet, regular exercise, health risks of tobacco and excessive alcohol, use of seatbelts, fire alarms in home, use of sunscreen). Also reviewed age and gender appropriate health screening as well as vaccine recommendations. My plan is  Tachycardia: asymptomatic, continue diltiazem 240 mg po qd  Intermittent cough, hx of allerg rhiin not ideally controlled: preliminary clinical dx of asthma -- PFTs reordered today (not done b/c of covid crisis restrictions on this testing), start Advair 2 inhalations of 250/50 in the AM , Flonase twice daily, albuterol trial inhaler 108 mcg as needed Labs: PFTs.   Vaccines: next time will discuss Tdap. Got both of his COVID vaccines  Spent 30 min with pt today, with >50% of this time spent in counseling and care coordination regarding the above problems.  Follow Up:  6 months    Signed: Abigail Miyamoto, MS3 28 Apr 2020   I personally was present during the history, physical exam, and medical decision-making activities of this service and have  verified that the service and findings are accurately documented in the student's note. Signed:  Santiago Bumpers, MD           04/28/2020

## 2020-04-28 NOTE — Progress Notes (Signed)
See med student note from this date. Signed:  Phil Francely Craw, MD           04/28/2020  

## 2020-04-29 ENCOUNTER — Telehealth: Payer: Self-pay

## 2020-04-29 NOTE — Telephone Encounter (Signed)
Yes okay to switch  °

## 2020-04-29 NOTE — Telephone Encounter (Signed)
Pharmacy calling to see if okay to change albuterol inhaler to generic proair. Insurance will not pay for generic ventolin.   Okay for switch? CB# [phone]

## 2020-04-29 NOTE — Telephone Encounter (Signed)
Called pt's pharmacy to advise okay for switch.

## 2020-05-06 HISTORY — PX: OTHER SURGICAL HISTORY: SHX169

## 2020-05-08 ENCOUNTER — Other Ambulatory Visit: Payer: Self-pay | Admitting: Family Medicine

## 2020-05-12 ENCOUNTER — Other Ambulatory Visit (HOSPITAL_COMMUNITY): Payer: BC Managed Care – PPO

## 2020-05-15 ENCOUNTER — Other Ambulatory Visit: Payer: Self-pay

## 2020-05-15 ENCOUNTER — Ambulatory Visit (INDEPENDENT_AMBULATORY_CARE_PROVIDER_SITE_OTHER): Payer: BC Managed Care – PPO | Admitting: Internal Medicine

## 2020-05-15 DIAGNOSIS — R053 Chronic cough: Secondary | ICD-10-CM

## 2020-05-15 DIAGNOSIS — R05 Cough: Secondary | ICD-10-CM | POA: Diagnosis not present

## 2020-05-15 LAB — PULMONARY FUNCTION TEST
DL/VA % pred: 98 %
DL/VA: 4.67 ml/min/mmHg/L
DLCO cor % pred: 104 %
DLCO cor: 34.23 ml/min/mmHg
DLCO unc % pred: 104 %
DLCO unc: 34.23 ml/min/mmHg
FEF 25-75 Post: 5.03 L/sec
FEF 25-75 Pre: 4.95 L/sec
FEF2575-%Change-Post: 1 %
FEF2575-%Pred-Post: 116 %
FEF2575-%Pred-Pre: 114 %
FEV1-%Change-Post: 0 %
FEV1-%Pred-Post: 103 %
FEV1-%Pred-Pre: 103 %
FEV1-Post: 4.64 L
FEV1-Pre: 4.64 L
FEV1FVC-%Change-Post: 1 %
FEV1FVC-%Pred-Pre: 102 %
FEV6-%Change-Post: -1 %
FEV6-%Pred-Post: 101 %
FEV6-%Pred-Pre: 102 %
FEV6-Post: 5.52 L
FEV6-Pre: 5.6 L
FEV6FVC-%Change-Post: 0 %
FEV6FVC-%Pred-Post: 101 %
FEV6FVC-%Pred-Pre: 101 %
FVC-%Change-Post: -1 %
FVC-%Pred-Post: 99 %
FVC-%Pred-Pre: 100 %
FVC-Post: 5.52 L
FVC-Pre: 5.61 L
Post FEV1/FVC ratio: 84 %
Post FEV6/FVC ratio: 100 %
Pre FEV1/FVC ratio: 83 %
Pre FEV6/FVC Ratio: 100 %
RV % pred: 103 %
RV: 1.84 L
TLC % pred: 108 %
TLC: 7.69 L

## 2020-05-15 NOTE — Progress Notes (Signed)
PFT done today. 

## 2020-05-16 DIAGNOSIS — L501 Idiopathic urticaria: Secondary | ICD-10-CM | POA: Diagnosis not present

## 2020-05-18 ENCOUNTER — Encounter: Payer: Self-pay | Admitting: Family Medicine

## 2020-05-19 ENCOUNTER — Ambulatory Visit (INDEPENDENT_AMBULATORY_CARE_PROVIDER_SITE_OTHER): Payer: BC Managed Care – PPO

## 2020-05-19 ENCOUNTER — Other Ambulatory Visit: Payer: Self-pay

## 2020-05-19 DIAGNOSIS — L501 Idiopathic urticaria: Secondary | ICD-10-CM | POA: Diagnosis not present

## 2020-05-22 DIAGNOSIS — L501 Idiopathic urticaria: Secondary | ICD-10-CM | POA: Diagnosis not present

## 2020-05-22 DIAGNOSIS — E876 Hypokalemia: Secondary | ICD-10-CM | POA: Diagnosis not present

## 2020-05-22 DIAGNOSIS — R899 Unspecified abnormal finding in specimens from other organs, systems and tissues: Secondary | ICD-10-CM | POA: Diagnosis not present

## 2020-05-22 DIAGNOSIS — K760 Fatty (change of) liver, not elsewhere classified: Secondary | ICD-10-CM | POA: Diagnosis not present

## 2020-05-22 DIAGNOSIS — Z8639 Personal history of other endocrine, nutritional and metabolic disease: Secondary | ICD-10-CM | POA: Diagnosis not present

## 2020-05-22 DIAGNOSIS — R05 Cough: Secondary | ICD-10-CM | POA: Diagnosis not present

## 2020-05-22 LAB — IRON,TIBC AND FERRITIN PANEL
%SAT: 18
Iron: 71
TIBC: 385

## 2020-05-22 LAB — BASIC METABOLIC PANEL: Potassium: 3.9 (ref 3.4–5.3)

## 2020-06-02 ENCOUNTER — Other Ambulatory Visit: Payer: Self-pay

## 2020-06-02 ENCOUNTER — Encounter: Payer: Self-pay | Admitting: Family Medicine

## 2020-06-02 ENCOUNTER — Ambulatory Visit (INDEPENDENT_AMBULATORY_CARE_PROVIDER_SITE_OTHER): Payer: BC Managed Care – PPO | Admitting: Family Medicine

## 2020-06-02 VITALS — BP 120/74 | HR 100 | Temp 98.7°F | Resp 16 | Ht 70.0 in | Wt 247.0 lb

## 2020-06-02 DIAGNOSIS — F419 Anxiety disorder, unspecified: Secondary | ICD-10-CM | POA: Diagnosis not present

## 2020-06-02 DIAGNOSIS — R000 Tachycardia, unspecified: Secondary | ICD-10-CM | POA: Diagnosis not present

## 2020-06-02 DIAGNOSIS — E611 Iron deficiency: Secondary | ICD-10-CM

## 2020-06-02 DIAGNOSIS — R0602 Shortness of breath: Secondary | ICD-10-CM | POA: Diagnosis not present

## 2020-06-02 MED ORDER — LORAZEPAM 0.5 MG PO TABS: ORAL_TABLET | ORAL | 0 refills | Status: DC

## 2020-06-02 NOTE — Progress Notes (Signed)
OFFICE VISIT  06/08/2020   CC:  Chief Complaint  Patient presents with   Follow-up    tachycardia    HPI:    Patient is a 34 y.o. Caucasian male who presents for 1 mo f/u tachycardia. Last visit I continued his diltiazem CD 240mg  qd. Intermittent cough, chronic.  Preliminary clinic dx of cough variant asthma. PFTs were re-ordered last visit. I started him on advair 250/50, 2 puffs qAM, flonase bid, and albut hfa for rescue.  INTERIM  HX: PFTs normal since last visit.   Says nothing different. Flonase made him gag/vomit. Still taking the advair 250/50.  Admits that some mornings are better than others. Takes albut every morning.  Occ uses alb later when gets home from work "if I feel like I am not breathing well".  No wheezing or cough or CP. He feels no diff regarding his HR. Says HR has been stable around 100 typically.    Says endo (? Dr. , ? Dr. Everardo All) told him he was "slightly iron deficient" and told him to take an otc iron supplement.  I don't have access to these labs today.  ROS: no fevers, no dizziness or presyncope or vertigo, no HAs, no rashes, no melena/hematochezia.  No polyuria or polydipsia.  No myalgias or arthralgias.  No focal weakness, paresthesias, or tremors.  No acute vision or hearing abnormalities. No n/v/d or abd pain.       Past Medical History:  Diagnosis Date   Angio-edema    amoxil rxn   Chronic fatigue    Diverticulosis    DOE (dyspnea on exertion)    PFTs normal ([place]) per pt.  PFTs normal here 05/2020. CXR normal here 03/2020.   Eczema    Hypogonadism in male    hx of-> testing 01/2020 showed testost in lower normal range (on no med)   Mononucleosis age 8   hospitalized x 1 wk   Tachycardia    Dr. 30[place]ia.  w/u NEG.  Dilt rx'd.   Urticaria     Past Surgical History:  Procedure Laterality Date   Pulm funct test  05/2020   NORMAL   TRANSTHORACIC ECHOCARDIOGRAM  09/13/2018    EF 55-60%, grd I DD.  Valves normal.   WISDOM TOOTH EXTRACTION Bilateral 2013    Outpatient Medications Prior to Visit  Medication Sig Dispense Refill   albuterol (VENTOLIN HFA) 108 (90 Base) MCG/ACT inhaler Inhale 2 puffs into the lungs every 6 (six) hours as needed for wheezing or shortness of breath. 18 g 0   diltiazem (CARDIZEM CD) 240 MG 24 hr capsule TAKE ONE CAPSULE BY MOUTH DAILY 90 capsule 0   finasteride (PROPECIA) 1 MG tablet Take 1 tablet (1 mg total) by mouth daily. 90 tablet 3   Fluticasone-Salmeterol (ADVAIR DISKUS) 250-50 MCG/DOSE AEPB Inhale 1 puff into the lungs 2 (two) times daily. 60 each 1   omalizumab (XOLAIR) 150 MG/ML prefilled syringe Inject 300 mg into the skin every 28 (twenty-eight) days.     cetirizine (ZYRTEC) 10 MG tablet Take two tablet to equal 20 mg twice a day for 7 days. (Patient not taking: Reported on 04/28/2020) 30 tablet 5   EPINEPHrine (EPIPEN 2-PAK) 0.3 mg/0.3 mL IJ SOAJ injection Inject 0.3 mLs (0.3 mg total) into the muscle once as needed for up to 1 dose (for severe allergic reaction). (Patient not taking: Reported on 04/28/2020) 1 each 1   famotidine (PEPCID) 10 MG tablet Take 4  tablets (40 mg total) by mouth 2 (two) times daily for 7 days. (Patient not taking: Reported on 06/02/2020) 30 tablet 0   fluticasone (FLONASE) 50 MCG/ACT nasal spray Place 2 sprays into both nostrils daily. (Patient not taking: Reported on 06/02/2020) 16 g 6   Facility-Administered Medications Prior to Visit  Medication Dose Route Frequency Provider Last Rate Last Admin   omalizumab Arvid Right) injection 300 mg  300 mg Subcutaneous Q28 days Valentina Shaggy, MD   300 mg at 05/19/20 1618    Allergies  Allergen Reactions   Amoxicillin Anaphylaxis and Rash    ROS As per HPI  PE: Vitals with BMI 06/02/2020 04/28/2020 02/18/2020  Height 5\' 10"  5\' 10"  5\' 10"   Weight 247 lbs 245 lbs 10 oz 239 lbs  BMI 35.44 78.29 56.21  Systolic 308 657 846  Diastolic 74 80  79  Pulse 100 74 90  O2 sat on RA today is 96%  Gen: Alert, well appearing.  Patient is oriented to person, place, time, and situation. AFFECT: pleasant, lucid thought and speech. No further exam today.  LABS:    Chemistry      Component Value Date/Time   NA 139 02/12/2020 0115   K 3.9 05/22/2020 0000   CL 102 02/12/2020 0115   CO2 24 02/12/2020 0115   BUN 17 02/12/2020 0115   CREATININE 1.28 (H) 02/12/2020 0115      Component Value Date/Time   CALCIUM 8.9 02/12/2020 0115   ALKPHOS 73 01/21/2020 1121   AST 24 01/21/2020 1121   ALT 32 01/21/2020 1121   BILITOT 0.5 01/21/2020 1121     No results found for: VITAMINB12 Lab Results  Component Value Date   WBC 7.5 02/12/2020   HGB 14.9 02/12/2020   HCT 42.6 02/12/2020   MCV 89.3 02/12/2020   PLT 358 02/12/2020   Lab Results  Component Value Date   TSH 2.66 01/21/2020   Lab Results  Component Value Date   IRON 71 05/22/2020   TIBC 385 05/22/2020    IMPRESSION AND PLAN:  1) Tachycardia, w/u unrevealing in the past (x 2). Improved/stable with diltiazem. He admits anxiety may be playing a role. See #2 below.  2) SOB: again, unclear cause.  PFTs normal, CXR normal, no hypoxia.  No indication for CT at this time.  He is not a smoker.   Doesn't seem like starting advair or use of prn albuterol has helped any. He admits that anxiety may be playing a role. He felt good very good after having an infusion of something at an alternative med place "with a lot of vitamin C and B12" .  He asks what I think about getting this IV b/c there is a place around here that does it and I said I know nothing about it and cannot recommend for it or against it.  Suspect some placebo effect. We decided to do trial of benzo: lorazepam 0.5mg , 1/2-1 tab bid-tid on more or less a scheduled basis and see if he feels better overall. Therapeutic expectations and side effect profile of medication discussed today.  Patient's questions answered.  3)  Iron deficiency, ? Mild.  Need to get records from Endo, which I think is Dr. Buddy Duty. He'll continue the iron tab he recently started.  An After Visit Summary was printed and given to the patient.  FOLLOW UP: Return in about 4 weeks (around 06/30/2020) for f/u tachycardia and sob/anxiety.  Signed:  Crissie Sickles, MD  06/08/2020 ° ° ° ° °

## 2020-06-03 ENCOUNTER — Encounter: Payer: Self-pay | Admitting: Family Medicine

## 2020-06-12 ENCOUNTER — Telehealth: Payer: Self-pay | Admitting: Family Medicine

## 2020-06-12 NOTE — Telephone Encounter (Signed)
LM for pt to returncall

## 2020-06-12 NOTE — Telephone Encounter (Signed)
Call pt and tell him I reviewed note and labs from Dr. Sharl Ma, the endocrinologist he saw on 05/22/20. His iron level was borderline low at the most.  He can continue his iron supplement and I'll recheck his iron labs and blood counts at next f/u visit with me. Fasting is not necessary for these labs.

## 2020-06-13 DIAGNOSIS — L501 Idiopathic urticaria: Secondary | ICD-10-CM | POA: Diagnosis not present

## 2020-06-16 ENCOUNTER — Other Ambulatory Visit: Payer: Self-pay

## 2020-06-16 ENCOUNTER — Ambulatory Visit (INDEPENDENT_AMBULATORY_CARE_PROVIDER_SITE_OTHER): Payer: BC Managed Care – PPO

## 2020-06-16 DIAGNOSIS — L501 Idiopathic urticaria: Secondary | ICD-10-CM

## 2020-06-16 NOTE — Telephone Encounter (Signed)
Pt called back and was advised

## 2020-06-27 ENCOUNTER — Encounter: Payer: Self-pay | Admitting: Family Medicine

## 2020-06-27 ENCOUNTER — Ambulatory Visit: Payer: BC Managed Care – PPO | Admitting: Family Medicine

## 2020-06-27 DIAGNOSIS — Z0289 Encounter for other administrative examinations: Secondary | ICD-10-CM

## 2020-06-27 NOTE — Progress Notes (Deleted)
OFFICE VISIT  06/27/2020   CC: No chief complaint on file.  HPI:    Patient is a 34 y.o. Caucasian male who presents for 3 week f/u anxiety. We started lorazepam (0.5mg , 1-2 tid) on prn basis based on the fact that we both felt that anxiety was playing a significant role in his problems with intermittent feeling of SOB as well as chronic fatigue.   Past Medical History:  Diagnosis Date  . Angio-edema    amoxil rxn  . Chronic fatigue   . Diverticulosis   . DOE (dyspnea on exertion)    [place]FTs normal ([place]) per pt.  PFTs normal here 05/2020. CXR normal here 03/2020.  Marland Kitchen Eczema   . Hepatic steatosis    noted on ct abd  . Hypogonadism in male    hx of-> testing 01/2020 showed testost in lower normal range (on no med)  . Mononucleosis age 39   hospitalized x 1 wk  . Tachycardia    Dr. Anne Fu[place]- [place] arrhythmia.  w/u NEG.  Dilt rx'd.  Marland Kitchen Urticaria     Past Surgical History:  Procedure Laterality Date  . Pulm funct test  05/2020   NORMAL  . TRANSTHORACIC ECHOCARDIOGRAM  09/13/2018   EF 55-60%, grd I DD.  Valves normal.  . WISDOM TOOTH EXTRACTION Bilateral 2013    Outpatient Medications Prior to Visit  Medication Sig Dispense Refill  . albuterol (VENTOLIN HFA) 108 (90 Base) MCG/ACT inhaler Inhale 2 puffs into the lungs every 6 (six) hours as needed for wheezing or shortness of breath. 18 g 0  . cetirizine (ZYRTEC) 10 MG tablet Take two tablet to equal 20 mg twice a day for 7 days. (Patient not taking: Reported on 04/28/2020) 30 tablet 5  . diltiazem (CARDIZEM CD) 240 MG 24 hr capsule TAKE ONE CAPSULE BY MOUTH DAILY 90 capsule 0  . EPINEPHrine (EPIPEN 2-PAK) 0.3 mg/0.3 mL IJ SOAJ injection Inject 0.3 mLs (0.3 mg total) into the muscle once as needed for up to 1 dose (for severe allergic reaction). (Patient not taking: Reported on 04/28/2020) 1 each 1  . famotidine (PEPCID) 10 MG tablet Take 4 tablets (40 mg total) by mouth 2 (two) times daily for 7 days.  (Patient not taking: Reported on 06/02/2020) 30 tablet 0  . finasteride (PROPECIA) 1 MG tablet Take 1 tablet (1 mg total) by mouth daily. 90 tablet 3  . fluticasone (FLONASE) 50 MCG/ACT nasal spray Place 2 sprays into both nostrils daily. (Patient not taking: Reported on 06/02/2020) 16 g 6  . Fluticasone-Salmeterol (ADVAIR DISKUS) 250-50 MCG/DOSE AEPB Inhale 1 puff into the lungs 2 (two) times daily. 60 each 1  . LORazepam (ATIVAN) 0.5 MG tablet 1 tab tid 90 tablet 0  . omalizumab (XOLAIR) 150 MG/ML prefilled syringe Inject 300 mg into the skin every 28 (twenty-eight) days.     Facility-Administered Medications Prior to Visit  Medication Dose Route Frequency Provider Last Rate Last Admin  . omalizumab Geoffry Paradise) injection 300 mg  300 mg Subcutaneous Q28 days Alfonse Spruce, MD   300 mg at 06/16/20 1623    Allergies  Allergen Reactions  . Amoxicillin Anaphylaxis and Rash    ROS As per HPI  PE: There were no vitals taken for this visit. ***  LABS:  Lab Results  Component Value Date   TSH 2.66 01/21/2020   Lab Results  Component Value Date   WBC 7.5 02/12/2020   HGB 14.9 02/12/2020   HCT  42.6 02/12/2020   MCV 89.3 02/12/2020   PLT 358 02/12/2020   Lab Results  Component Value Date   CREATININE 1.28 (H) 02/12/2020   BUN 17 02/12/2020   NA 139 02/12/2020   K 3.9 05/22/2020   CL 102 02/12/2020   CO2 24 02/12/2020   Lab Results  Component Value Date   ALT 32 01/21/2020   AST 24 01/21/2020   ALKPHOS 73 01/21/2020   BILITOT 0.5 01/21/2020   Lab Results  Component Value Date   CHOL 246 (H) 01/21/2020   Lab Results  Component Value Date   HDL 47.00 01/21/2020   No results found for: [facility] Lab Results  Component Value Date   TRIG 324.0 (H) 01/21/2020   Lab Results  Component Value Date   CHOLHDL 5 01/21/2020   Lab Results  Component Value Date   HGBA1C 5.1 01/21/2020   Lab Results  Component Value Date   TESTOSTERONE 322 01/21/2020    IMPRESSION  AND PLAN:  No problem-specific Assessment & Plan notes found for this encounter.   An After Visit Summary was printed and given to the patient.  FOLLOW UP: No follow-ups on file.  Signed:  Santiago Bumpers, MD           06/27/2020

## 2020-06-28 ENCOUNTER — Other Ambulatory Visit: Payer: Self-pay | Admitting: Family Medicine

## 2020-07-14 ENCOUNTER — Ambulatory Visit: Payer: Self-pay

## 2020-07-16 DIAGNOSIS — L501 Idiopathic urticaria: Secondary | ICD-10-CM

## 2020-07-17 ENCOUNTER — Ambulatory Visit (INDEPENDENT_AMBULATORY_CARE_PROVIDER_SITE_OTHER): Payer: BC Managed Care – PPO

## 2020-07-17 ENCOUNTER — Other Ambulatory Visit: Payer: Self-pay

## 2020-07-17 DIAGNOSIS — L501 Idiopathic urticaria: Secondary | ICD-10-CM | POA: Diagnosis not present

## 2020-07-30 ENCOUNTER — Other Ambulatory Visit: Payer: Self-pay

## 2020-07-30 DIAGNOSIS — Z20822 Contact with and (suspected) exposure to covid-19: Secondary | ICD-10-CM

## 2020-07-31 LAB — NOVEL CORONAVIRUS, NAA: SARS-CoV-2, NAA: NOT DETECTED

## 2020-07-31 LAB — SARS-COV-2, NAA 2 DAY TAT

## 2020-08-06 ENCOUNTER — Other Ambulatory Visit: Payer: BC Managed Care – PPO

## 2020-08-08 DIAGNOSIS — M5137 Other intervertebral disc degeneration, lumbosacral region: Secondary | ICD-10-CM | POA: Diagnosis not present

## 2020-08-08 DIAGNOSIS — M5386 Other specified dorsopathies, lumbar region: Secondary | ICD-10-CM | POA: Diagnosis not present

## 2020-08-08 DIAGNOSIS — M9903 Segmental and somatic dysfunction of lumbar region: Secondary | ICD-10-CM | POA: Diagnosis not present

## 2020-08-08 DIAGNOSIS — M5134 Other intervertebral disc degeneration, thoracic region: Secondary | ICD-10-CM | POA: Diagnosis not present

## 2020-08-13 ENCOUNTER — Other Ambulatory Visit: Payer: Self-pay

## 2020-08-13 MED ORDER — DILTIAZEM HCL ER COATED BEADS 240 MG PO CP24: 240.0000 mg | ORAL_CAPSULE | Freq: Every day | ORAL | 0 refills | Status: DC

## 2020-08-14 ENCOUNTER — Other Ambulatory Visit: Payer: Self-pay

## 2020-08-14 ENCOUNTER — Ambulatory Visit (INDEPENDENT_AMBULATORY_CARE_PROVIDER_SITE_OTHER): Payer: BC Managed Care – PPO

## 2020-08-14 DIAGNOSIS — L501 Idiopathic urticaria: Secondary | ICD-10-CM | POA: Diagnosis not present

## 2020-08-19 ENCOUNTER — Ambulatory Visit: Payer: BC Managed Care – PPO | Admitting: Allergy & Immunology

## 2020-08-19 ENCOUNTER — Telehealth: Payer: Self-pay

## 2020-08-19 NOTE — Telephone Encounter (Signed)
Patient refill request  diltiazem (CARDIZEM CD) 240 MG 24 hr capsule [[id number]]    Karin Golden [facility] [address] [place] - [address]

## 2020-08-19 NOTE — Telephone Encounter (Signed)
RX already at pharmacy. Verified w/ Karin Golden.  Patient aware.  He is also aware to Schedule OV before running out of medication. He wasn't able to schedule at this time.

## 2020-08-28 ENCOUNTER — Encounter: Payer: Self-pay | Admitting: Allergy & Immunology

## 2020-08-28 ENCOUNTER — Ambulatory Visit: Payer: BC Managed Care – PPO | Admitting: Allergy & Immunology

## 2020-08-28 ENCOUNTER — Other Ambulatory Visit: Payer: Self-pay

## 2020-08-28 VITALS — BP 122/82 | HR 101 | Temp 98.3°F | Resp 18 | Ht 70.0 in | Wt 245.2 lb

## 2020-08-28 DIAGNOSIS — R000 Tachycardia, unspecified: Secondary | ICD-10-CM

## 2020-08-28 DIAGNOSIS — R0602 Shortness of breath: Secondary | ICD-10-CM | POA: Diagnosis not present

## 2020-08-28 DIAGNOSIS — R232 Flushing: Secondary | ICD-10-CM | POA: Diagnosis not present

## 2020-08-28 DIAGNOSIS — L501 Idiopathic urticaria: Secondary | ICD-10-CM | POA: Diagnosis not present

## 2020-08-28 MED ORDER — OMEPRAZOLE 40 MG PO CPDR: 40.0000 mg | DELAYED_RELEASE_CAPSULE | Freq: Every day | ORAL | 1 refills | Status: DC

## 2020-08-28 NOTE — Patient Instructions (Addendum)
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 - I do not think that additional labs are needed at this time. - Stop the Allegra/Zyrtec to see how you do without it.  - Continue with Pepcid since this might be helping with your reflux.   2. GERD - Continue with Pepcid as above. - Add on omeprazole 40mg  daily for one month. - We will see if this helps the cough.  3. Tachycardia  - Continue with diltiazem.  - I would ask Dr. whether the addition of a beta blocker would be helpful. - We are also going to refer you to see someone at [facility] for an evaluation of all of your symptoms.   4. Return in about 6 months (around 02/25/2021).   Please inform 02/27/2021 of any Emergency Department visits, hospitalizations, or changes in symptoms. Call us before going to the ED for breathing or allergy symptoms since we might be able to fit you in for a sick visit. Feel free to contact us anytime with any questions, problems, or concerns.  It was a pleasure to see you again today!  Websites that have reliable patient information: 1. American Academy of Asthma, Allergy, and Immunology: www.aaaai.org 2. Food Allergy Research and Education (FARE): foodallergy.org 3. Mothers of Asthmatics: http://www.asthmacommunitynetwork.org 4. American College of Allergy, Asthma, and Immunology: [place]   Make sure you are registered to vote! If you have moved or changed any of your contact information, you will need to get this updated before voting!

## 2020-08-28 NOTE — Progress Notes (Signed)
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 Donato Schultz)  Shortness of breath- no improvement with Breo  Amoxicillin allergy   Adverse reaction to prednisone - ? known side effect    [name] remains on the Xolair and although it has decreased his episodes of flushing and itching, and has not resolved them completely.  He would like to go ahead and get a referral to a tertiary care center, which I think is completely appropriate.  We have done a very thorough work-up and I honestly do not know what of the labs I would order.  I would consider the addition of a beta-blocker, if his PCP feels this is appropriate, to see if this would help with his tachycardia.  We will put in the referral for Dr. Elijah Birk at [facility] to see if we can get him in sooner rather than later. We are also going to add on omeprazole to see if this helps with his reflux symptoms more effectively.   Plan/Recommendations:   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 - I do not think that additional labs are needed at this time. - Stop the Allegra/Zyrtec to see how you do without it.  - Continue with Pepcid since this might be helping with your reflux.   2. GERD - Continue with Pepcid as above. - Add on omeprazole 40mg  daily for one month. - We will see if this helps the cough.  3. Tachycardia  - Continue with diltiazem.  - I would ask Dr. whether the addition of a beta blocker would be helpful. - We are also going to refer you to see someone at [facility] for an evaluation of all of your symptoms.   4. Return in about 6 months (around 02/25/2021).  Subjective:   Peter Blankenship is a 34 y.o. male presenting today for follow up of  Chief Complaint  Patient presents with  . Allergic Rhinitis     New medication seems to be helpful. No  concerns or questions at this time.    Peter Blankenship has a history of the following: Patient Active Problem List   Diagnosis Date Noted  . Flushing 11/15/2018  . 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 Tachycardia 08/19/2018  . SOB (shortness of breath) 08/19/2018    History obtained from: chart review and patient.  [name] is a 34 y.o. male presenting for a follow up visit.  He has a history of idiopathic anaphylaxis for which we initiated Xolair earlier this year.  He also has a history of tachycardia which is managed by cardiology.  He has had shortness of breath in the past with no improvement with Breo.  Since last visit, he has continued to get Xolair every 28 days.  He is not entirely sure that it is helped, but he has not had a full-blown flushing event since that time.  His wife thinks that he has more good days than bad days on the Xolair.  [name] other antihistamines every day.  He has not tried going off of them yet.  He is open to doing so.  He continues to follow with cardiology.  Apparently this work-up has been completely normal.  He is on diltiazem right now, which was apparently started by his primary care provider, Dr. 31.  This did help bring his heart rate from the 130-140 range  down to the 110 range.  However, it is still rather uncomfortable for him.  He has not increased the diltiazem and has not been on a beta-blocker to his knowledge.  He continues to have these episodes of shortness of breath.  In the past, we have treated him with asthma medications, including Breo, which did not help at all.  We have since stopped this.  He does have a history of reflux but has never been on a proton pump inhibitor.  He is open to trying that to see if it helps.  He is going to be changing his job soon as well to a less stressful position with a different company.  He is wondering if this will help with his symptoms.  He is wondering if he needs to go see an academic [facility] for  another opinion just to be on the safe side.  I am very open to that.  Otherwise, there have been no changes to his past medical history, surgical history, family history, or social history.    Review of Systems  Constitutional: Negative.  Negative for chills, fever, malaise/fatigue and weight loss.  HENT: Negative.  Negative for congestion, ear discharge and ear pain.   Eyes: Negative for pain, discharge and redness.  Respiratory: Negative for cough, sputum production, shortness of breath and wheezing.   Cardiovascular: Negative.  Negative for chest pain and palpitations.  Gastrointestinal: Negative for abdominal pain, constipation, diarrhea, heartburn, nausea and vomiting.  Skin: Positive for rash. Negative for itching.       Positive for intermittent flushing episodes.   Neurological: Negative for dizziness and headaches.  Endo/Heme/Allergies: Negative for environmental allergies. Does not bruise/bleed easily.       Objective:   Blood pressure 122/82, pulse (!) 101, temperature 98.3 F (36.8 C), resp. rate 18, height 5\' 10"  (1.778 m), weight 245 lb 3.2 oz (111.2 kg), SpO2 95 %. Body mass index is 35.18 kg/m.   Physical Exam:  Physical Exam Constitutional:      Appearance: He is well-developed. He is obese.  HENT:     Head: Normocephalic and atraumatic.     Right Ear: Tympanic membrane, ear canal and external ear normal.     Left Ear: Tympanic membrane, ear canal and external ear normal.     Nose: No nasal deformity, septal deviation, mucosal edema or rhinorrhea.     Right Turbinates: Enlarged. Not swollen or pale.     Left Turbinates: Enlarged. Not swollen or pale.     Right Sinus: No maxillary sinus tenderness or frontal sinus tenderness.     Left Sinus: No maxillary sinus tenderness or frontal sinus tenderness.     Mouth/Throat:     Mouth: Mucous membranes are not pale and not dry.     Pharynx: Uvula midline.     Comments: Tonsils unremarkable. No  cobblestoning. Eyes:     General:        Right eye: No discharge.        Left eye: No discharge.     Conjunctiva/sclera: Conjunctivae normal.     Right eye: Right conjunctiva is not injected. No chemosis.    Left eye: Left conjunctiva is not injected. No chemosis.    Pupils: Pupils are equal, round, and reactive to light.  Cardiovascular:     Rate and Rhythm: Normal rate and regular rhythm.     Heart sounds: Normal heart sounds.  Pulmonary:     Effort: Pulmonary effort is normal. No tachypnea, accessory  muscle usage or respiratory distress.     Breath sounds: Normal breath sounds. No wheezing, rhonchi or rales.     Comments: Moving air well in all lung fields. No increased work of breathing noted.. Chest:     Chest wall: No tenderness.  Lymphadenopathy:     Cervical: No cervical adenopathy.  Skin:    Coloration: Skin is not pale.     Findings: No abrasion, erythema, petechiae or rash. Rash is not papular, urticarial or vesicular.  Neurological:     Mental Status: He is alert.  Psychiatric:        Behavior: Behavior is cooperative.      Diagnostic studies: none      Malachi Bonds, MD  [facility]

## 2020-08-31 ENCOUNTER — Encounter: Payer: Self-pay | Admitting: Allergy & Immunology

## 2020-09-01 ENCOUNTER — Telehealth: Payer: Self-pay

## 2020-09-01 NOTE — Telephone Encounter (Signed)
Peter Blankenship is scheduled for 09/19/2020 @ 10:40 with Timmie Foerster, MD.  Unfortunately Dr Elijah Birk didn't have anything till late November. I can change him to Dr Elijah Birk in November if you like Dr Dellis Anes?    Peter Blankenship has been informed of this appointment information.

## 2020-09-01 NOTE — Telephone Encounter (Signed)
-----   Message from Alfonse Spruce, MD sent at 08/31/2020 11:07 AM EDT ----- Referral placed for Dr. Elijah Birk at [facility].

## 2020-09-02 NOTE — Telephone Encounter (Signed)
Awesome - thank you! I am fine with the earlier appointment.   Malachi Bonds, MD [facility]

## 2020-09-10 DIAGNOSIS — L501 Idiopathic urticaria: Secondary | ICD-10-CM

## 2020-09-11 ENCOUNTER — Other Ambulatory Visit: Payer: Self-pay

## 2020-09-11 ENCOUNTER — Ambulatory Visit (INDEPENDENT_AMBULATORY_CARE_PROVIDER_SITE_OTHER): Payer: BC Managed Care – PPO

## 2020-09-11 DIAGNOSIS — L501 Idiopathic urticaria: Secondary | ICD-10-CM | POA: Diagnosis not present

## 2020-09-18 ENCOUNTER — Other Ambulatory Visit: Payer: Self-pay

## 2020-09-18 MED ORDER — FLUTICASONE-SALMETEROL 250-50 MCG/DOSE IN AEPB: INHALATION_SPRAY | RESPIRATORY_TRACT | 2 refills | Status: DC

## 2020-10-08 DIAGNOSIS — L501 Idiopathic urticaria: Secondary | ICD-10-CM | POA: Diagnosis not present

## 2020-10-09 ENCOUNTER — Other Ambulatory Visit: Payer: Self-pay

## 2020-10-09 ENCOUNTER — Ambulatory Visit (INDEPENDENT_AMBULATORY_CARE_PROVIDER_SITE_OTHER): Payer: BC Managed Care – PPO

## 2020-10-09 DIAGNOSIS — L501 Idiopathic urticaria: Secondary | ICD-10-CM

## 2020-10-24 DIAGNOSIS — M5386 Other specified dorsopathies, lumbar region: Secondary | ICD-10-CM | POA: Diagnosis not present

## 2020-10-24 DIAGNOSIS — M5134 Other intervertebral disc degeneration, thoracic region: Secondary | ICD-10-CM | POA: Diagnosis not present

## 2020-10-24 DIAGNOSIS — M5137 Other intervertebral disc degeneration, lumbosacral region: Secondary | ICD-10-CM | POA: Diagnosis not present

## 2020-10-24 DIAGNOSIS — M9903 Segmental and somatic dysfunction of lumbar region: Secondary | ICD-10-CM | POA: Diagnosis not present

## 2020-10-28 DIAGNOSIS — M9903 Segmental and somatic dysfunction of lumbar region: Secondary | ICD-10-CM | POA: Diagnosis not present

## 2020-10-28 DIAGNOSIS — M5137 Other intervertebral disc degeneration, lumbosacral region: Secondary | ICD-10-CM | POA: Diagnosis not present

## 2020-10-28 DIAGNOSIS — M5134 Other intervertebral disc degeneration, thoracic region: Secondary | ICD-10-CM | POA: Diagnosis not present

## 2020-10-28 DIAGNOSIS — M5386 Other specified dorsopathies, lumbar region: Secondary | ICD-10-CM | POA: Diagnosis not present

## 2020-11-06 ENCOUNTER — Ambulatory Visit: Payer: Self-pay

## 2021-07-30 ENCOUNTER — Ambulatory Visit (INDEPENDENT_AMBULATORY_CARE_PROVIDER_SITE_OTHER): Payer: PRIVATE HEALTH INSURANCE | Admitting: Family Medicine

## 2021-07-30 ENCOUNTER — Encounter: Payer: Self-pay | Admitting: Family Medicine

## 2021-07-30 ENCOUNTER — Other Ambulatory Visit: Payer: Self-pay

## 2021-07-30 VITALS — BP 118/79 | HR 81 | Temp 97.5°F | Resp 16 | Ht 70.0 in | Wt 249.6 lb

## 2021-07-30 DIAGNOSIS — L649 Androgenic alopecia, unspecified: Secondary | ICD-10-CM | POA: Diagnosis not present

## 2021-07-30 DIAGNOSIS — J3089 Other allergic rhinitis: Secondary | ICD-10-CM | POA: Diagnosis not present

## 2021-07-30 MED ORDER — FINASTERIDE 1 MG PO TABS: 1.0000 mg | ORAL_TABLET | Freq: Every day | ORAL | 3 refills | Status: DC

## 2021-07-30 MED ORDER — AZELASTINE HCL 0.1 % NA SOLN: 2.0000 | Freq: Two times a day (BID) | NASAL | 12 refills | Status: DC

## 2021-07-30 NOTE — Progress Notes (Signed)
OFFICE VISIT  08/02/2021  CC:  Chief Complaint  Patient presents with   Follow-up    RCI    HPI:    Patient is a 35 y.o. Caucasian male who presents for f/u GERD, hx of tachycardia. He has questionable hx of asthma, hx of urticaria--was followed by allergy/asthma specialist but not recently. I last saw him about 11 mo ago.  INTERIM HX: All is going well.  Changed jobs and recently moved to new house--less tense/stressed. Says HR feeling fine since these changes.  Says he stopped all of his medications a couple months ago.. Wants to get back on finasteride for his hair loss.  Says xolair didn't make any improvement in his allergies or his ? Of asthma.  Not on advair anymore and is not needing albut in a long time.  No wheezing or SOB or chest tightness. Sudafed helps with his cough but he would like to not take this very often if possible.  Any nasal spray makes him vomit.  Says cough is usually brought on by activity or lots of talking, no help in the past from use of albuterol or advair for his cough. Some phlegm in back of throat most of the time.  GERD: no signif sx's since getting off PPI and H2 blocker.  Past Medical History:  Diagnosis Date   Angio-edema    amoxil rxn   Chronic fatigue    Diverticulosis    DOE (dyspnea on exertion)    PFTs normal ([place]) per pt.  PFTs normal here 05/2020. CXR normal here 03/2020.   Eczema    GERD (gastroesophageal reflux disease)    Hepatic steatosis    noted on ct abd   Hypogonadism in male    hx of-> testing 01/2020 showed testost in lower normal range (on no med)   Male pattern alopecia    Mononucleosis age 83   hospitalized x 1 wk   Tachycardia    [place]ia.  w/u NEG.  Dilt rx'd.   Urticaria     Past Surgical History:  Procedure Laterality Date   Pulm funct test  05/2020   NORMAL   TRANSTHORACIC ECHOCARDIOGRAM  09/13/2018   EF 55-60%, grd I DD.  Valves normal.   WISDOM TOOTH  EXTRACTION Bilateral 2013    Outpatient Medications Prior to Visit  Medication Sig Dispense Refill   famotidine (PEPCID) 10 MG tablet Take 4 tablets (40 mg total) by mouth 2 (two) times daily for 7 days. (Patient not taking: Reported on 06/02/2020) 30 tablet 0   albuterol (VENTOLIN HFA) 108 (90 Base) MCG/ACT inhaler Inhale 2 puffs into the lungs every 6 (six) hours as needed for wheezing or shortness of breath. (Patient not taking: Reported on 07/30/2021) 18 g 0   cetirizine (ZYRTEC) 10 MG tablet Take two tablet to equal 20 mg twice a day for 7 days. (Patient not taking: Reported on 07/30/2021) 30 tablet 5   diltiazem (CARDIZEM CD) 240 MG 24 hr capsule Take 1 capsule (240 mg total) by mouth daily. OFFICE VISIT NEEDED (Patient not taking: Reported on 07/30/2021) 30 capsule 0   EPINEPHrine (EPIPEN 2-PAK) 0.3 mg/0.3 mL IJ SOAJ injection Inject 0.3 mLs (0.3 mg total) into the muscle once as needed for up to 1 dose (for severe allergic reaction). (Patient not taking: Reported on 07/30/2021) 1 each 1   finasteride (PROPECIA) 1 MG tablet Take 1 tablet (1 mg total) by mouth daily. (Patient not taking: Reported  on 07/30/2021) 90 tablet 3   fluticasone (FLONASE) 50 MCG/ACT nasal spray Place 2 sprays into both nostrils daily. (Patient not taking: No sig reported) 16 g 6   Fluticasone-Salmeterol (ADVAIR DISKUS) 250-50 MCG/DOSE AEPB INHALE ONE PUFF INTO THE LUNGS TWICE A DAY (Patient not taking: Reported on 07/30/2021) 60 each 2   LORazepam (ATIVAN) 0.5 MG tablet 1 tab tid (Patient not taking: Reported on 07/30/2021) 90 tablet 0   omalizumab (XOLAIR) 150 MG/ML prefilled syringe Inject 300 mg into the skin every 28 (twenty-eight) days. (Patient not taking: Reported on 07/30/2021)     omeprazole (PRILOSEC) 40 MG capsule Take 1 capsule (40 mg total) by mouth daily. 30 capsule 1   omalizumab Geoffry Paradise) injection 300 mg      No facility-administered medications prior to visit.    Allergies  Allergen Reactions    Amoxicillin Anaphylaxis and Rash    ROS As per HPI  PE: Vitals with BMI 07/30/2021 08/28/2020 06/02/2020  Height 5\' 10"  5\' 10"  5\' 10"   Weight 249 lbs 10 oz 245 lbs 3 oz 247 lbs  BMI 35.81 35.18 35.44  Systolic 118 122  Diastolic 79 82 74  Pulse 81 101 100   Gen: Alert, well appearing.  Patient is oriented to person, place, time, and situation. AFFECT: pleasant, lucid thought and speech. ENT: Ears: EACs clear, normal epithelium.  TMs with good light reflex and landmarks bilaterally.  Eyes: no injection, icteris, swelling, or exudate.  EOMI, PERRLA. Nose: Septum midline. Mild diffuse injection, turbinate edema is mild, small amount of yellowish nasal mucous.  Mouth: lips without lesion/swelling.  Oral mucosa pink and moist.  Dentition intact and without obvious caries or gingival swelling.  Oropharynx without erythema, exudate, or swelling. NoPND.   LABS:  Lab Results  Component Value Date   TSH 2.66 01/21/2020   Lab Results  Component Value Date   WBC 7.5 02/12/2020   HGB 14.9 02/12/2020   HCT 42.6 02/12/2020   MCV 89.3 02/12/2020   PLT 358 02/12/2020   Lab Results  Component Value Date   CREATININE 1.28 (H) 02/12/2020   BUN 17 02/12/2020   NA 139 02/12/2020   K 3.9 05/22/2020   CL 102 02/12/2020   CO2 24 02/12/2020   Lab Results  Component Value Date   ALT 32 01/21/2020   AST 24 01/21/2020   ALKPHOS 73 01/21/2020   BILITOT 0.5 01/21/2020   Lab Results  Component Value Date   CHOL 246 (H) 01/21/2020   Lab Results  Component Value Date   HDL 47.00 01/21/2020   No results found for: [facility] Lab Results  Component Value Date   TRIG 324.0 (H) 01/21/2020   Lab Results  Component Value Date   CHOLHDL 5 01/21/2020   Lab Results  Component Value Date   HGBA1C 5.1 01/21/2020   IMPRESSION AND PLAN:  1) Vasomotor rhinitis, PND cough likely.  ? Silent ger or cough-variant asthma contributing?? Will try to see if he can tolerate astelin nasal spray  hs. He'll try to minimize use of sudafed for his cough.  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  3) GER: seems to have resolved, possibly from some dietary changes. He no longer needs PPI or H2 blocker at this time.  An After Visit Summary was printed and given to the patient.  FOLLOW UP: Return if symptoms worsen or fail to improve.  Signed:  01/23/2020, MD  08/02/2021  

## 2021-10-23 IMAGING — CR DG CHEST 2V
2 series · 2 of 2 positions shown · non-contrast
Comparison: None.

CLINICAL DATA: Intermittent cough and shortness of breath

EXAM:
CHEST - 2 VIEW

[w chest pa]
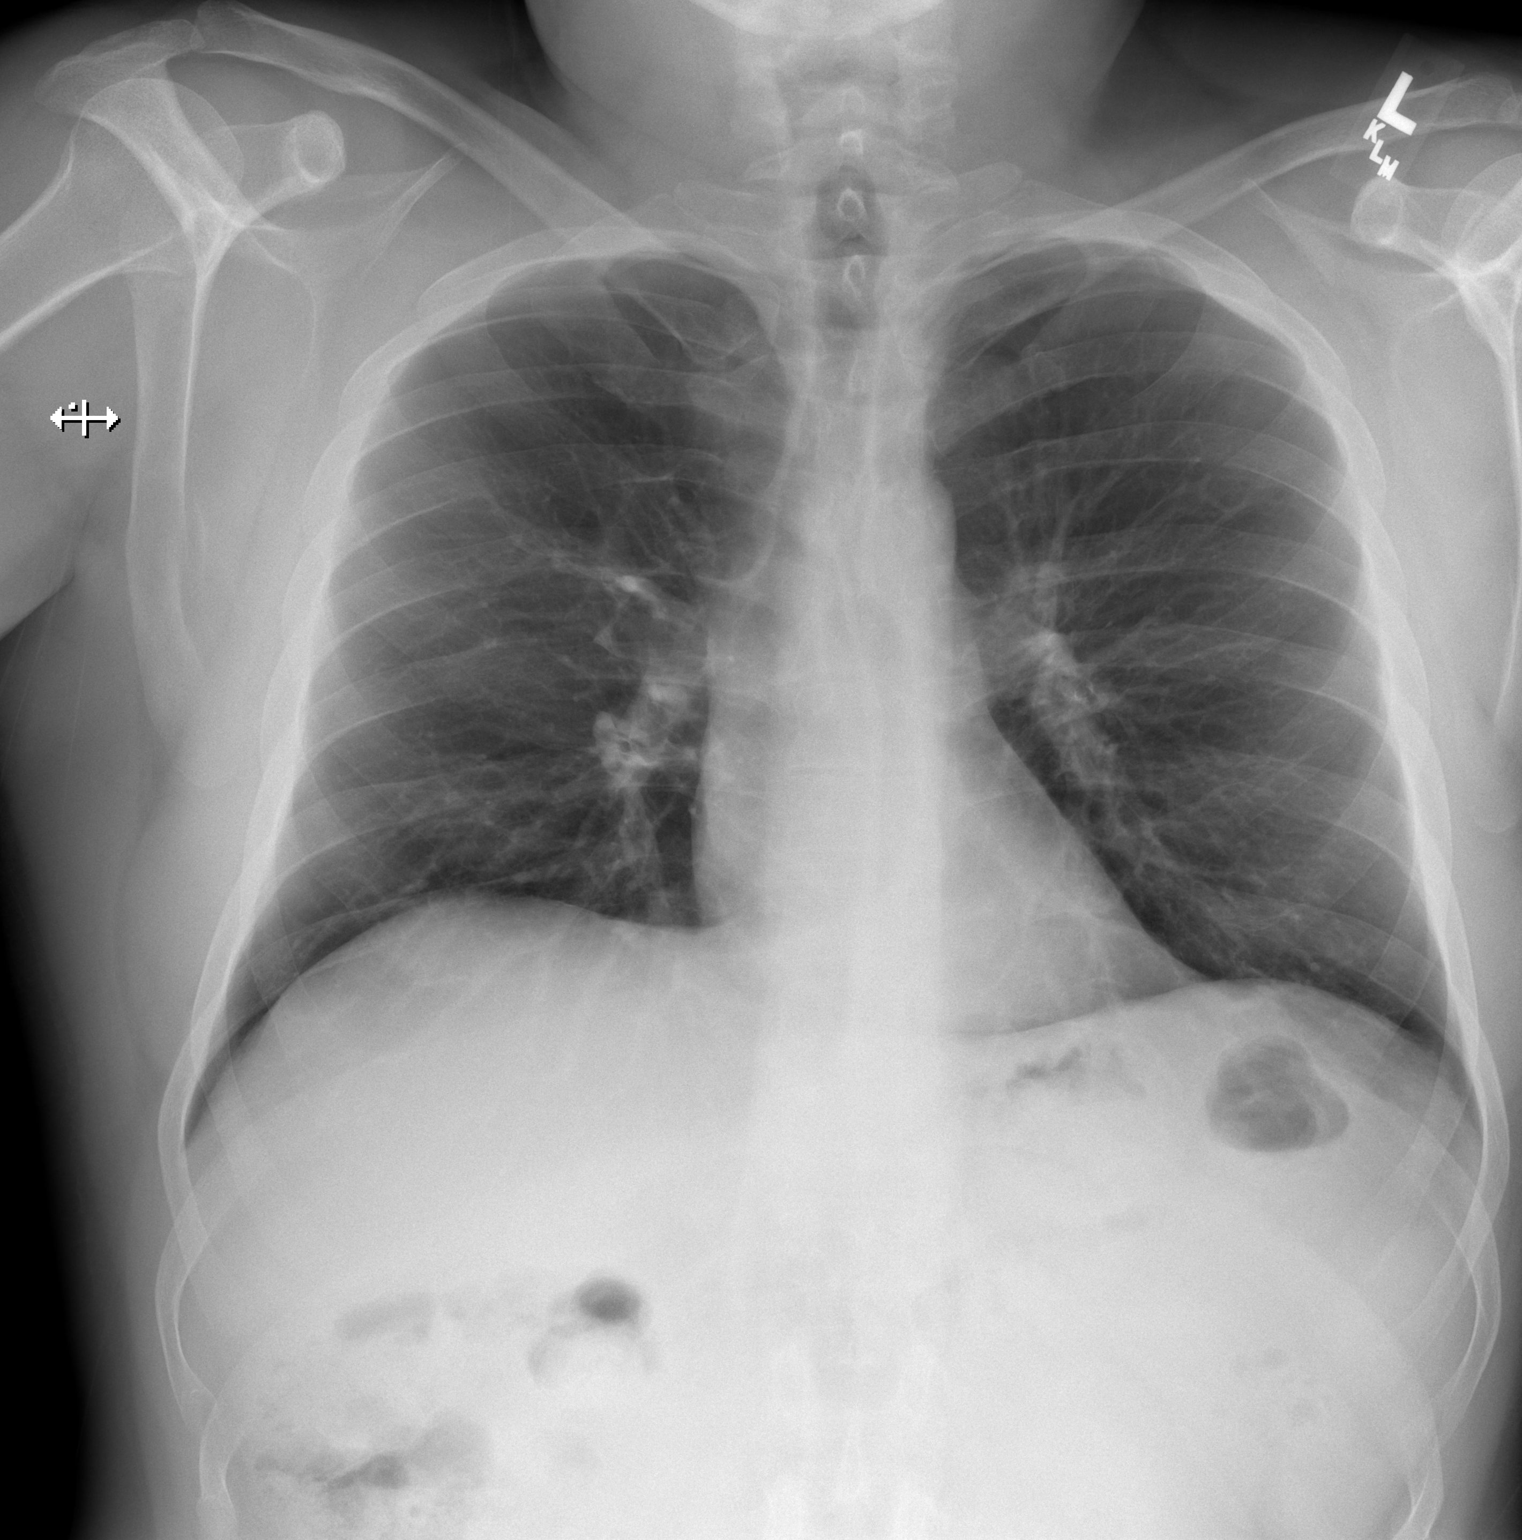

[w chest lat]
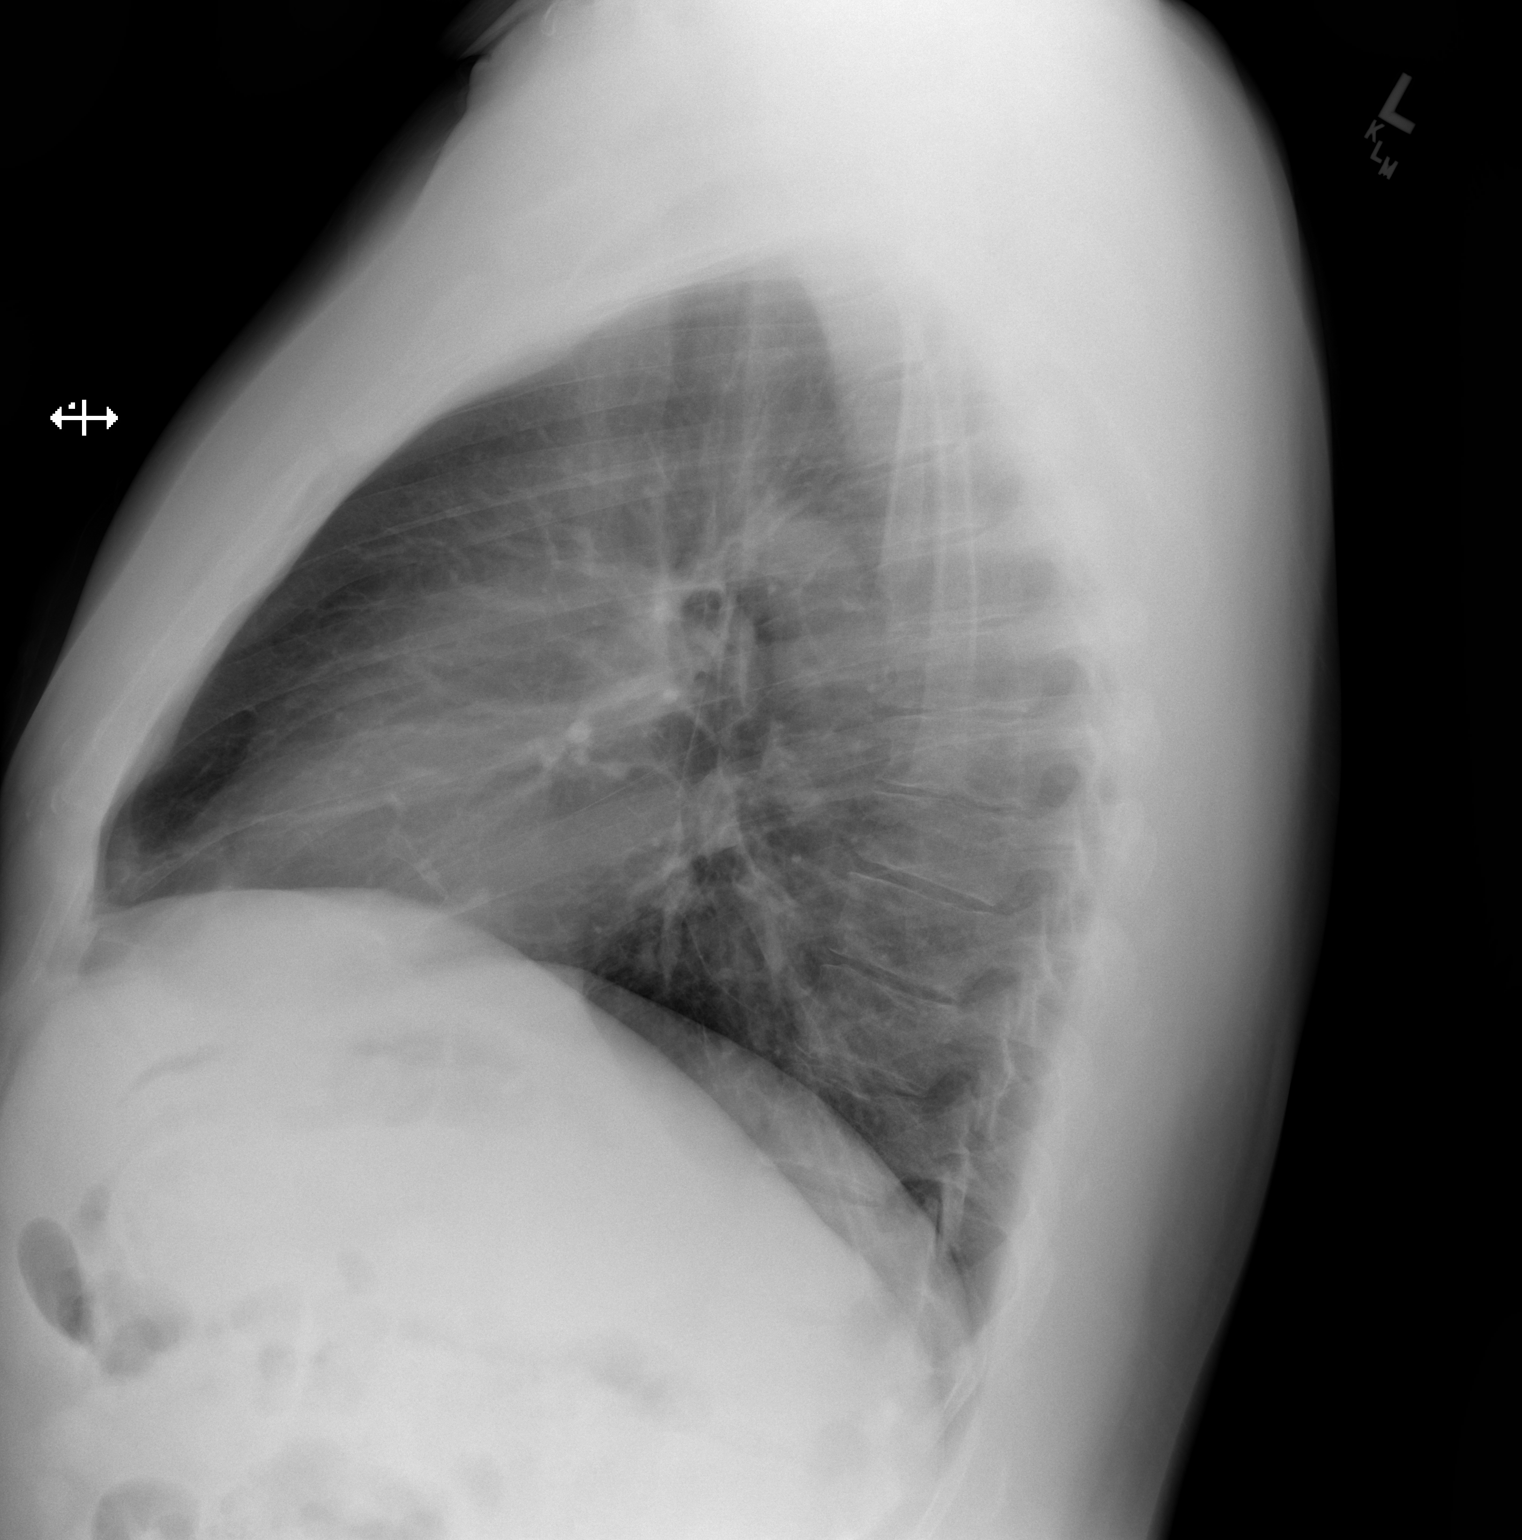

[2 of 2 positions shown; findings below may reference images not displayed]

FINDINGS: The heart size and mediastinal contours are within normal limits.
Both lungs are clear. No pleural effusion or pneumothorax. The
visualized skeletal structures are unremarkable.
IMPRESSION: No acute process in the chest.

## 2022-05-31 DIAGNOSIS — M5134 Other intervertebral disc degeneration, thoracic region: Secondary | ICD-10-CM | POA: Diagnosis not present

## 2022-05-31 DIAGNOSIS — M5386 Other specified dorsopathies, lumbar region: Secondary | ICD-10-CM | POA: Diagnosis not present

## 2022-05-31 DIAGNOSIS — M9903 Segmental and somatic dysfunction of lumbar region: Secondary | ICD-10-CM | POA: Diagnosis not present

## 2022-05-31 DIAGNOSIS — M9901 Segmental and somatic dysfunction of cervical region: Secondary | ICD-10-CM | POA: Diagnosis not present

## 2022-05-31 DIAGNOSIS — M5137 Other intervertebral disc degeneration, lumbosacral region: Secondary | ICD-10-CM | POA: Diagnosis not present

## 2022-07-23 DIAGNOSIS — M5137 Other intervertebral disc degeneration, lumbosacral region: Secondary | ICD-10-CM | POA: Diagnosis not present

## 2022-07-23 DIAGNOSIS — M5134 Other intervertebral disc degeneration, thoracic region: Secondary | ICD-10-CM | POA: Diagnosis not present

## 2022-07-23 DIAGNOSIS — M5386 Other specified dorsopathies, lumbar region: Secondary | ICD-10-CM | POA: Diagnosis not present

## 2022-07-23 DIAGNOSIS — M9903 Segmental and somatic dysfunction of lumbar region: Secondary | ICD-10-CM | POA: Diagnosis not present

## 2022-08-19 ENCOUNTER — Other Ambulatory Visit: Payer: Self-pay | Admitting: Family Medicine

## 2022-12-17 ENCOUNTER — Telehealth: Payer: Self-pay | Admitting: Family Medicine

## 2022-12-17 MED ORDER — FINASTERIDE 1 MG PO TABS: 1.0000 mg | ORAL_TABLET | Freq: Every day | ORAL | 0 refills | Status: DC

## 2022-12-17 NOTE — Telephone Encounter (Signed)
Pt had to reschedule upcoming appt due to traveling for work. I did inform him that his medication requires an OV before refilling so he understands this request may be denied. He is requesting Finasteride and  Pharmacy is  now changed to the [facility] at the [facility], [place], [place] [address]. No long the one on [address].

## 2022-12-17 NOTE — Telephone Encounter (Signed)
Patient advised 30 day supply given until rescheduled appt

## 2022-12-22 ENCOUNTER — Encounter: Payer: PRIVATE HEALTH INSURANCE | Admitting: Family Medicine

## 2023-01-11 ENCOUNTER — Other Ambulatory Visit (HOSPITAL_COMMUNITY)
Admission: RE | Admit: 2023-01-11 | Discharge: 2023-01-11 | Disposition: A | Discharge status: Home / Self Care | Payer: BC Managed Care – PPO | Source: Ambulatory Visit | Attending: Family Medicine | Admitting: Family Medicine

## 2023-01-11 ENCOUNTER — Encounter: Payer: Self-pay | Admitting: Family Medicine

## 2023-01-11 ENCOUNTER — Ambulatory Visit (INDEPENDENT_AMBULATORY_CARE_PROVIDER_SITE_OTHER): Payer: BC Managed Care – PPO | Admitting: Family Medicine

## 2023-01-11 VITALS — BP 88/57 | HR 82 | Temp 97.9°F | Ht 71.0 in | Wt 218.0 lb

## 2023-01-11 DIAGNOSIS — Z113 Encounter for screening for infections with a predominantly sexual mode of transmission: Secondary | ICD-10-CM | POA: Insufficient documentation

## 2023-01-11 DIAGNOSIS — E669 Obesity, unspecified: Secondary | ICD-10-CM | POA: Diagnosis not present

## 2023-01-11 DIAGNOSIS — Z1159 Encounter for screening for other viral diseases: Secondary | ICD-10-CM

## 2023-01-11 DIAGNOSIS — Z131 Encounter for screening for diabetes mellitus: Secondary | ICD-10-CM | POA: Diagnosis not present

## 2023-01-11 DIAGNOSIS — K760 Fatty (change of) liver, not elsewhere classified: Secondary | ICD-10-CM | POA: Diagnosis not present

## 2023-01-11 DIAGNOSIS — E781 Pure hyperglyceridemia: Secondary | ICD-10-CM | POA: Diagnosis not present

## 2023-01-11 DIAGNOSIS — Z0000 Encounter for general adult medical examination without abnormal findings: Secondary | ICD-10-CM

## 2023-01-11 DIAGNOSIS — Z7689 Persons encountering health services in other specified circumstances: Secondary | ICD-10-CM

## 2023-01-11 LAB — LIPID PANEL
Cholesterol: 257 mg/dL — ABNORMAL HIGH (ref 0–200)
HDL: 50.4 mg/dL (ref >39.00)
LDL Cholesterol: 174 mg/dL — ABNORMAL HIGH (ref 0–99)
NonHDL: 206.15
Total CHOL/HDL Ratio: 5
Triglycerides: 160 mg/dL — ABNORMAL HIGH (ref 0.0–149.0)
VLDL: 32 mg/dL (ref 0.0–40.0)

## 2023-01-11 LAB — COMPREHENSIVE METABOLIC PANEL WITH GFR
ALT: 19 U/L (ref 0–53)
AST: 19 U/L (ref 0–37)
Albumin: 4.9 g/dL (ref 3.5–5.2)
Alkaline Phosphatase: 69 U/L (ref 39–117)
BUN: 14 mg/dL (ref 6–23)
CO2: 25 meq/L (ref 19–32)
Calcium: 10 mg/dL (ref 8.4–10.5)
Chloride: 101 meq/L (ref 96–112)
Creatinine, Ser: 1.05 mg/dL (ref 0.40–1.50)
GFR: 90.46 mL/min
Glucose, Bld: 81 mg/dL (ref 70–99)
Potassium: 4.6 meq/L (ref 3.5–5.1)
Sodium: 138 meq/L (ref 135–145)
Total Bilirubin: 0.8 mg/dL (ref 0.2–1.2)
Total Protein: 7.9 g/dL (ref 6.0–8.3)

## 2023-01-11 LAB — CBC
HCT: 44.5 % (ref 39.0–52.0)
Hemoglobin: 15.6 g/dL (ref 13.0–17.0)
MCHC: 35.1 g/dL (ref 30.0–36.0)
MCV: 89.7 fl (ref 78.0–100.0)
Platelets: 371 10*3/uL (ref 150.0–400.0)
RBC: 4.97 Mil/uL (ref 4.22–5.81)
RDW: 13.9 % (ref 11.5–15.5)
WBC: 6.2 10*3/uL (ref 4.0–10.5)

## 2023-01-11 LAB — TSH: TSH: 2.26 u[IU]/mL (ref 0.35–5.50)

## 2023-01-11 LAB — HEMOGLOBIN A1C: Hgb A1c MFr Bld: 5 % (ref 4.6–6.5)

## 2023-01-11 MED ORDER — TIRZEPATIDE-WEIGHT MANAGEMENT 10 MG/0.5ML ~~LOC~~ SOAJ: 10.0000 mg | SUBCUTANEOUS | 1 refills | Status: DC

## 2023-01-11 MED ORDER — FINASTERIDE 1 MG PO TABS: 1.0000 mg | ORAL_TABLET | Freq: Every day | ORAL | 3 refills | Status: DC

## 2023-01-11 NOTE — Progress Notes (Signed)
Office Note 01/11/2023  CC:  Chief Complaint  Patient presents with   Annual Exam    Pt is not fasting ( diet soda)    HPI:  Patient is a 37 y.o. male who is here for annual health maintenance exam. Peter Blankenship feels well.  Has had great success with mounjaro for wt loss: 40 lb in 1 yr.  Has been getting the medication through an online carrier of some type. On the 10 mg weekly dosing. Wants me to assume responsibility for the prescription now. He is dieting very well, exercising fairly regularly. No adverse effects from the medication.   Past Medical History:  Diagnosis Date   Angio-edema    amoxil rxn   Chronic fatigue    Diverticulosis    DOE (dyspnea on exertion)    [place]FTs [place] ([place]) per pt.  PFTs normal here 05/2020. CXR normal here 03/2020.   Eczema    GERD (gastroesophageal reflux disease)    Hepatic steatosis    noted on ct abd   Hypogonadism in male    hx of-> testing 01/2020 showed testost in lower normal range (on no med)   Male pattern alopecia    Mononucleosis age 78   hospitalized x 1 wk   Obesity, Class I, BMI 30-34.9    Tachycardia    Dr. Marlou Porch[place]- [place]ia.  w/u NEG.  Dilt rx'd.   Urticaria     Past Surgical History:  Procedure Laterality Date   Pulm funct test  05/2020   NORMAL   TRANSTHORACIC ECHOCARDIOGRAM  09/13/2018   EF 55-60%, grd I DD.  Valves normal.   WISDOM TOOTH EXTRACTION Bilateral 2013    Family History  Problem Relation Age of Onset   High Cholesterol Mother    Testicular cancer Brother    High Cholesterol Brother     Social History   Socioeconomic History   Marital status: Married    Spouse name: Not on file   Number of children: Not on file   Years of education: Not on file   Highest education level: Not on file  Occupational History   Not on file  Tobacco Use   Smoking status: Never   Smokeless tobacco: Never  Vaping Use   Vaping Use: Never used  Substance and Sexual Activity    Alcohol use: Yes    Comment: occasional    Drug use: Yes    Types: Marijuana   Sexual activity: Not on file  Other Topics Concern   Not on file  Social History Narrative   Married, no children.   Educ: college degree.   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   No tob.   Alc: Minimal   No drugs.   Social Determinants of Health   Financial Resource Strain: Not on file  Food Insecurity: Not on file  Transportation Needs: Not on file  Physical Activity: Not on file  Stress: Not on file  Social Connections: Not on file  Intimate Partner Violence: Not on file    Outpatient Medications Prior to Visit  Medication Sig Dispense Refill   azelastine (ASTELIN) 0.1 % nasal spray Place 2 sprays into both nostrils 2 (two) times daily. (Patient not taking: Reported on 01/11/2023) 30 mL 12   famotidine (PEPCID) 10 MG tablet Take 4 tablets (40 mg total) by mouth 2 (two) times daily for 7 days. (Patient not taking: Reported on 06/02/2020) 30 tablet 0   finasteride (  PROPECIA) 1 MG tablet Take 1 tablet (1 mg total) by mouth daily. 30 tablet 0   No facility-administered medications prior to visit.    Allergies  Allergen Reactions   Amoxicillin Anaphylaxis and Rash    Review of Systems  Constitutional:  Negative for appetite change, chills, fatigue and fever.  HENT:  Negative for congestion, dental problem, ear pain and sore throat.   Eyes:  Negative for discharge, redness and visual disturbance.  Respiratory:  Negative for cough, chest tightness, shortness of breath and wheezing.   Cardiovascular:  Negative for chest pain, palpitations and leg swelling.  Gastrointestinal:  Negative for abdominal pain, blood in stool, diarrhea, nausea and vomiting.  Genitourinary:  Negative for difficulty urinating, dysuria, flank pain, frequency, hematuria and urgency.  Musculoskeletal:  Negative for arthralgias, back pain, joint swelling, myalgias and neck stiffness.  Skin:   Negative for pallor and rash.  Neurological:  Negative for dizziness, speech difficulty, weakness and headaches.  Hematological:  Negative for adenopathy. Does not bruise/bleed easily.  Psychiatric/Behavioral:  Negative for confusion and sleep disturbance. The patient is not nervous/anxious.     PE;    01/11/2023    9:49 AM 07/30/2021   11:02 AM 08/28/2020    3:32 PM  Vitals with BMI  Height 5\' 11"  5\' 10"  5\' 10"   Weight 218 lbs 249 lbs 10 oz 245 lbs 3 oz  BMI 30.42 62.83 15.17  Systolic 88 616 073  Diastolic 57 79 82  Pulse 82 81 101    Gen: Alert, well appearing.  Patient is oriented to person, place, time, and situation. AFFECT: pleasant, lucid thought and speech. ENT: Ears: EACs clear, normal epithelium.  TMs with good light reflex and landmarks bilaterally.  Eyes: no injection, icteris, swelling, or exudate.  EOMI, PERRLA. Nose: no drainage or turbinate edema/swelling.  No injection or focal lesion.  Mouth: lips without lesion/swelling.  Oral mucosa pink and moist.  Dentition intact and without obvious caries or gingival swelling.  Oropharynx without erythema, exudate, or swelling.  Neck: supple/nontender.  No LAD, mass, or TM.  Carotid pulses 2+ bilaterally, without bruits. CV: RRR, no m/r/g.   LUNGS: CTA bilat, nonlabored resps, good aeration in all lung fields. ABD: soft, NT, ND, BS normal.  No hepatospenomegaly or mass.  No bruits. EXT: no clubbing, cyanosis, or edema.  Musculoskeletal: no joint swelling, erythema, warmth, or tenderness.  ROM of all joints intact. Skin - no sores or suspicious lesions or rashes or color changes  Pertinent labs:  Lab Results  Component Value Date   TSH 2.66 01/21/2020   Lab Results  Component Value Date   WBC 7.5 02/12/2020   HGB 14.9 02/12/2020   HCT 42.6 02/12/2020   MCV 89.3 02/12/2020   PLT 358 02/12/2020   Lab Results  Component Value Date   CREATININE 1.28 (H) 02/12/2020   BUN 17 02/12/2020   NA 139 02/12/2020   K 3.9  05/22/2020   CL 102 02/12/2020   CO2 24 02/12/2020   Lab Results  Component Value Date   ALT 32 01/21/2020   AST 24 01/21/2020   ALKPHOS 73 01/21/2020   BILITOT 0.5 01/21/2020   Lab Results  Component Value Date   CHOL 246 (H) 01/21/2020   Lab Results  Component Value Date   HDL 47.00 01/21/2020   No results found for: "LDLCALC" Lab Results  Component Value Date   TRIG 324.0 (H) 01/21/2020   Lab Results  Component Value Date   CHOLHDL  5 01/21/2020   Lab Results  Component Value Date   IRON 71 05/22/2020   TIBC 385 05/22/2020   Lab Results  Component Value Date   HGBA1C 5.1 01/21/2020   ASSESSMENT AND PLAN:   #1 health maintenance exam: Reviewed age and gender appropriate health maintenance issues (prudent diet, regular exercise, health risks of tobacco and excessive alcohol, use of seatbelts, fire alarms in home, use of sunscreen).  Also reviewed age and gender appropriate health screening as well as vaccine recommendations. Vaccines: Tdap->declines. Labs: fasting HP, hemoglobin A1c, hep C screening. he also request screening for sexually transmitted infections: Urine GC and chlamydia, HIV antibody, RPR.  2 encounter for weight management. He has dropped his BMI from 35-30 over the last year with [facility] in combination with great exercise and dietary habits. He wants to switch over to Zepbound formulation of tirzepatide, 10 mg weekly dosing prescribed today. Comorbidity: Hepatic steatosis and mixed hyperlipidemia. Checking hemoglobin A1c , cmet, and lipids today.  #3 male pattern alopecia. Continue finasteride 1 mg/day.  Refilled today.  An After Visit Summary was printed and given to the patient.  FOLLOW UP:  Return in about 6 months (around 07/12/2023) for f/u wt mgmt.  Signed:  Crissie Sickles, MD           01/11/2023

## 2023-01-11 NOTE — Patient Instructions (Signed)
Health Maintenance, Male Adopting a healthy lifestyle and getting preventive care are important in promoting health and wellness. Ask your health care provider about: The right schedule for you to have regular tests and exams. Things you can do on your own to prevent diseases and keep yourself healthy. What should I know about diet, weight, and exercise? Eat a healthy diet  Eat a diet that includes plenty of vegetables, fruits, low-fat dairy products, and lean protein. Do not eat a lot of foods that are high in solid fats, added sugars, or sodium. Maintain a healthy weight Body mass index (BMI) is a measurement that can be used to identify possible weight problems. It estimates body fat based on height and weight. Your health care provider can help determine your BMI and help you achieve or maintain a healthy weight. Get regular exercise Get regular exercise. This is one of the most important things you can do for your health. Most adults should: Exercise for at least 150 minutes each week. The exercise should increase your heart rate and make you sweat (moderate-intensity exercise). Do strengthening exercises at least twice a week. This is in addition to the moderate-intensity exercise. Spend less time sitting. Even light physical activity can be beneficial. Watch cholesterol and blood lipids Have your blood tested for lipids and cholesterol at 38 years of age, then have this test every 5 years. You may need to have your cholesterol levels checked more often if: Your lipid or cholesterol levels are high. You are older than 38 years of age. You are at high risk for heart disease. What should I know about cancer screening? Many types of cancers can be detected early and may often be prevented. Depending on your health history and family history, you may need to have cancer screening at various ages. This may include screening for: Colorectal cancer. Prostate cancer. Skin cancer. Lung  cancer. What should I know about heart disease, diabetes, and high blood pressure? Blood pressure and heart disease High blood pressure causes heart disease and increases the risk of stroke. This is more likely to develop in people who have high blood pressure readings or are overweight. Talk with your health care provider about your target blood pressure readings. Have your blood pressure checked: Every 3-5 years if you are 18-39 years of age. Every year if you are 40 years old or older. If you are between the ages of 65 and 75 and are a current or former smoker, ask your health care provider if you should have a one-time screening for abdominal aortic aneurysm (AAA). Diabetes Have regular diabetes screenings. This checks your fasting blood sugar level. Have the screening done: Once every three years after age 45 if you are at a normal weight and have a low risk for diabetes. More often and at a younger age if you are overweight or have a high risk for diabetes. What should I know about preventing infection? Hepatitis B If you have a higher risk for hepatitis B, you should be screened for this virus. Talk with your health care provider to find out if you are at risk for hepatitis B infection. Hepatitis C Blood testing is recommended for: Everyone born from 1945 through 1965. Anyone with known risk factors for hepatitis C. Sexually transmitted infections (STIs) You should be screened each year for STIs, including gonorrhea and chlamydia, if: You are sexually active and are younger than 38 years of age. You are older than 38 years of age and your   health care provider tells you that you are at risk for this type of infection. Your sexual activity has changed since you were last screened, and you are at increased risk for chlamydia or gonorrhea. Ask your health care provider if you are at risk. Ask your health care provider about whether you are at high risk for HIV. Your health care provider  may recommend a prescription medicine to help prevent HIV infection. If you choose to take medicine to prevent HIV, you should first get tested for HIV. You should then be tested every 3 months for as long as you are taking the medicine. Follow these instructions at home: Alcohol use Do not drink alcohol if your health care provider tells you not to drink. If you drink alcohol: Limit how much you have to 0-2 drinks a day. Know how much alcohol is in your drink. In the [place] one drink equals one 12 oz bottle of beer (355 mL), one 5 oz glass of wine (148 mL), or one 1 oz glass of hard liquor (44 mL). Lifestyle Do not use any products that contain nicotine or tobacco. These products include cigarettes, chewing tobacco, and vaping devices, such as e-cigarettes. If you need help quitting, ask your health care provider. Do not use street drugs. Do not share needles. Ask your health care provider for help if you need support or information about quitting drugs. General instructions Schedule regular health, dental, and eye exams. Stay current with your vaccines. Tell your health care provider if: You often feel depressed. You have ever been abused or do not feel safe at home. Summary Adopting a healthy lifestyle and getting preventive care are important in promoting health and wellness. Follow your health care provider's instructions about healthy diet, exercising, and getting tested or screened for diseases. Follow your health care provider's instructions on monitoring your cholesterol and blood pressure. This information is not intended to replace advice given to you by your health care provider. Make sure you discuss any questions you have with your health care provider. Document Revised: 04/13/2021 Document Reviewed: 04/13/2021 Elsevier Patient Education  2023 [employer]  

## 2023-01-12 LAB — URINE CYTOLOGY ANCILLARY ONLY
Chlamydia: NEGATIVE
Comment: NEGATIVE
Comment: NORMAL
Neisseria Gonorrhea: NEGATIVE

## 2023-01-12 LAB — RPR: RPR Ser Ql: NONREACTIVE

## 2023-01-12 LAB — HIV ANTIBODY (ROUTINE TESTING W REFLEX): HIV 1&2 Ab, 4th Generation: NONREACTIVE

## 2023-01-12 LAB — HEPATITIS C ANTIBODY: Hepatitis C Ab: NONREACTIVE

## 2023-01-13 ENCOUNTER — Encounter: Payer: Self-pay | Admitting: Family Medicine

## 2023-01-15 ENCOUNTER — Encounter: Payer: Self-pay | Admitting: Family Medicine

## 2023-01-20 ENCOUNTER — Other Ambulatory Visit (HOSPITAL_COMMUNITY): Payer: Self-pay

## 2023-01-20 NOTE — Telephone Encounter (Signed)
Please confirm if any other recent PA forms received for patient

## 2023-01-20 NOTE — Telephone Encounter (Signed)
noted 

## 2023-01-20 NOTE — Telephone Encounter (Signed)
Patient Advocate Encounter   Received notification from [facility] that prior authorization for Zepbound is required.   PA submitted on 01/20/2023 Key [id number] Status is pending

## 2023-01-21 NOTE — Telephone Encounter (Signed)
Pharmacy Patient Advocate Encounter  Received notification from [facility] that the request for prior authorization for Zepbound has been denied due to .    Please be advised we currently do not have a Pharmacist to review denials, therefore you will need to process appeals accordingly as needed. Thanks for your support at this time.   You may fax [phone] to appeal.

## 2023-02-10 ENCOUNTER — Encounter: Payer: Self-pay | Admitting: Family Medicine

## 2023-02-11 MED ORDER — DUTASTERIDE 0.5 MG PO CAPS: 0.5000 mg | ORAL_CAPSULE | Freq: Every day | ORAL | 6 refills | Status: DC

## 2023-02-11 NOTE — Telephone Encounter (Signed)
I'm okay with a switch to dutasteride. Rx sent to pharmacy today.

## 2023-05-09 ENCOUNTER — Encounter: Payer: Self-pay | Admitting: Family Medicine

## 2023-07-12 ENCOUNTER — Ambulatory Visit: Payer: BC Managed Care – PPO | Admitting: Family Medicine

## 2023-10-14 ENCOUNTER — Other Ambulatory Visit: Payer: Self-pay | Admitting: Family Medicine

## 2023-11-15 ENCOUNTER — Other Ambulatory Visit: Payer: Self-pay | Admitting: Family Medicine

## 2024-01-13 ENCOUNTER — Ambulatory Visit: Payer: BC Managed Care – PPO | Admitting: Family Medicine

## 2024-01-13 VITALS — BP 122/78 | HR 81 | Temp 97.7°F | Resp 18 | Ht 71.0 in | Wt 206.5 lb

## 2024-01-13 DIAGNOSIS — E66811 Obesity, class 1: Secondary | ICD-10-CM | POA: Diagnosis not present

## 2024-01-13 DIAGNOSIS — Z0001 Encounter for general adult medical examination with abnormal findings: Secondary | ICD-10-CM

## 2024-01-13 DIAGNOSIS — Z23 Encounter for immunization: Secondary | ICD-10-CM | POA: Diagnosis not present

## 2024-01-13 DIAGNOSIS — N522 Drug-induced erectile dysfunction: Secondary | ICD-10-CM | POA: Diagnosis not present

## 2024-01-13 DIAGNOSIS — L649 Androgenic alopecia, unspecified: Secondary | ICD-10-CM | POA: Diagnosis not present

## 2024-01-13 DIAGNOSIS — L309 Dermatitis, unspecified: Secondary | ICD-10-CM

## 2024-01-13 DIAGNOSIS — R7989 Other specified abnormal findings of blood chemistry: Secondary | ICD-10-CM | POA: Diagnosis not present

## 2024-01-13 MED ORDER — TADALAFIL 5 MG PO TABS: 5.0000 mg | ORAL_TABLET | Freq: Every day | ORAL | 11 refills | Status: AC

## 2024-01-13 MED ORDER — DUTASTERIDE 0.5 MG PO CAPS: 0.5000 mg | ORAL_CAPSULE | Freq: Every day | ORAL | 1 refills | Status: DC

## 2024-01-13 MED ORDER — TIRZEPATIDE-WEIGHT MANAGEMENT 10 MG/0.5ML ~~LOC~~ SOAJ: 10.0000 mg | SUBCUTANEOUS | 5 refills | Status: DC

## 2024-01-13 MED ORDER — TADALAFIL 5 MG PO TABS: 5.0000 mg | ORAL_TABLET | Freq: Every day | ORAL | 11 refills | Status: DC

## 2024-01-13 MED ORDER — TRIAMCINOLONE ACETONIDE 0.1 % EX CREA: 1.0000 | TOPICAL_CREAM | Freq: Two times a day (BID) | CUTANEOUS | 3 refills | Status: AC

## 2024-01-13 NOTE — Progress Notes (Signed)
 Complete physical exam  Patient: Peter Blankenship   DOB: 22-Feb-1986   38 y.o. Male  MRN: [id number]  Subjective:    Chief Complaint  Patient presents with   New Patient (Initial Visit)    Peter Blankenship is a 38 y.o. male who presents today for a complete physical exam. He reports consuming a general diet. The patient does not participate in regular exercise at present. He generally feels well. He reports sleeping well for the most part, has a stressful job and sometimes wakes up in the middle of the night.  He does have additional problems to discuss today.   Patient is also here for [facility] visit, he reports he has been using Tirzepatide  for weight loss, has been getting it online through a compounding pharmacy. States that he has had great response to the medication, states that it is helping with his sinus tachycardia and his chronic joint pain as well. Would like to try getting the medication from the [facility]. He reports he is engaging in intermittent fasting, watching his carbs.   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    Most recent fall risk assessment:    01/13/2024   10:36 AM  Fall Risk   Falls in the past year? 0  Number falls in past yr: 0  Injury with Fall? 0  Risk for fall due to : No Fall Risks  Follow up Falls evaluation completed     Most recent depression screenings:    01/11/2023    9:50 AM 07/30/2021   11:08 AM  PHQ 2/9 Scores  PHQ - 2 Score 0 0    Vision:Not within last year  and no vision issues to report and Dental: No current dental problems and Receives regular dental care --[facility], was recommended to see an orthodontist  Patient Active Problem List   Diagnosis Date Noted   Obesity, Class I, BMI 30-34.9 01/18/2024   Androgenic alopecia 01/18/2024   Drug-induced erectile dysfunction 01/18/2024   Eczema of face 01/18/2024    Flushing 11/15/2018   Low testosterone  11/15/2018   Tachycardia 08/19/2018   SOB (shortness of breath) 08/19/2018      Patient Care Team: Ozell Heron HERO, MD as PCP - General (Family Medicine) Jeffrie Oneil BROCKS, MD as PCP - Cardiology (Cardiology) Brenna Adine CROME, DO as Consulting Physician (Pulmonary Disease) Iva Marty Saltness, MD as Consulting Physician (Allergy  and Immunology) Faythe Purchase, MD as Consulting Physician (Endocrinology)   Outpatient Medications Prior to Visit  Medication Sig   [DISCONTINUED] dutasteride  (AVODART ) 0.5 MG capsule Take 1 capsule (0.5 mg total) by mouth daily.   [DISCONTINUED] tirzepatide  (ZEPBOUND ) 10 MG/0.5ML Pen Inject 10 mg into the skin once a week.   No facility-administered medications prior to visit.    Review of Systems  HENT:  Negative for hearing loss.   Eyes:  Negative for blurred vision.  Respiratory:  Negative for shortness of breath.   Cardiovascular:  Negative for chest pain.  Gastrointestinal: Negative.   Genitourinary: Negative.   Musculoskeletal:  Negative for back pain.  Neurological:  Negative for headaches.  Psychiatric/Behavioral:  Negative for depression.   All other systems reviewed and are negative.      Objective:     BP 122/78 (BP Location: Right Arm, Patient Position: Sitting, Cuff Size: Normal)   Pulse 81   Temp 97.7 F (36.5 C) (Oral)  Resp 18   Ht 5' 11 (1.803 m)   Wt 206 lb 8 oz (93.7 kg)   SpO2 98%   BMI 28.80 kg/m  Wt Readings from Last 3 Encounters:  01/13/24 206 lb 8 oz (93.7 kg)  01/11/23 218 lb (98.9 kg)  07/30/21 249 lb 9.6 oz (113.2 kg)      Physical Exam Vitals reviewed.  Constitutional:      Appearance: Normal appearance. He is well-groomed and normal weight.  HENT:     Right Ear: Tympanic membrane and ear canal normal.     Left Ear: Tympanic membrane and ear canal normal.     Mouth/Throat:     Mouth: Mucous membranes are moist.     Pharynx: No posterior oropharyngeal  erythema.  Eyes:     Extraocular Movements: Extraocular movements intact.     Conjunctiva/sclera: Conjunctivae normal.  Neck:     Thyroid : No thyromegaly.  Cardiovascular:     Rate and Rhythm: Normal rate and regular rhythm.     Heart sounds: S1 normal and S2 normal. No murmur heard. Pulmonary:     Effort: Pulmonary effort is normal.     Breath sounds: Normal breath sounds and air entry. No rales.  Abdominal:     General: Abdomen is flat. Bowel sounds are normal.  Musculoskeletal:     Right lower leg: No edema.     Left lower leg: No edema.  Lymphadenopathy:     Cervical: No cervical adenopathy.  Neurological:     General: No focal deficit present.     Mental Status: He is alert and oriented to person, place, and time.     Gait: Gait is intact.  Psychiatric:        Mood and Affect: Mood and affect normal.      No results found for any visits on 01/13/24.     Assessment & Plan:    Routine Health Maintenance and Physical Exam  Immunization History  Administered Date(s) Administered   PFIZER(Purple Top)SARS-COV-2 Vaccination 03/14/2020, 04/04/2020   Tdap 01/13/2024    Health Maintenance  Topic Date Due   INFLUENZA VACCINE  Never done   COVID-19 Vaccine (3 - 2024-25 season) 08/07/2023   DTaP/Tdap/Td (2 - Td or Tdap) 01/12/2034   Hepatitis C Screening  Completed   HIV Screening  Completed   HPV VACCINES  Aged Out    Discussed health benefits of physical activity, and encouraged him to engage in regular exercise appropriate for his age and condition.  Obesity, Class I, BMI 30-34.9 Assessment & Plan: On zepbound  which he is getting online currently, states he would like to try getting it from [facility], is engaging in diet and exercise plan currently.   Orders: -     Tirzepatide -Weight Management; Inject 10 mg into the skin once a week.  Dispense: 2 mL; Refill: 5 -     Lipid panel; Future  Androgenic alopecia Assessment & Plan: On dutasteride , reports that  it is working for him, will continue as prescribed, refills sent  Orders: -     Dutasteride ; Take 1 capsule (0.5 mg total) by mouth daily.  Dispense: 90 capsule; Refill: 1  Drug-induced erectile dysfunction Assessment & Plan: Due to the medication for alopecia, will call in cialis  for the pt. Risks/benefits of the medication discussed with patient in visit.   Orders: -     Tadalafil ; Take 1 tablet (5 mg total) by mouth daily.  Dispense: 30 tablet; Refill: 11  Encounter for general  adult medical examination with abnormal findings Physical exam findings noted above, counseled patient on healthy eating and exercise recommendations, handouts given, labs ordered for annual surveillance. Health maintenance reviewed, pt declined flu vaccine today.  -     Comprehensive metabolic panel; Future -     CBC with Differential/Platelet; Future  Low testosterone  Assessment & Plan: History of, pt is requesting a new level to be drawn. Orders placed.   Orders: -     Testosterone ; Future  Immunization due -     Tdap vaccine greater than or equal to 7yo IM  Eczema of face Assessment & Plan: Very dry, erythematous patches of skin on his face, seen on physical exam today, will start him on triamcinolone  cream, pt will let me know if this works for him, if not then he may need new referral to dermatology, reports he is already failed antifungal creams for this rash.   Orders: -     Triamcinolone  Acetonide; Apply 1 Application topically 2 (two) times daily. Use for 14 days, then 14 days off  Dispense: 30 g; Refill: 3    Return in about 1 year (around 01/12/2025) for annual physical exam.     Heron CHRISTELLA Sharper, MD

## 2024-01-17 ENCOUNTER — Encounter: Payer: Self-pay | Admitting: Family Medicine

## 2024-01-17 ENCOUNTER — Other Ambulatory Visit (INDEPENDENT_AMBULATORY_CARE_PROVIDER_SITE_OTHER): Payer: BC Managed Care – PPO

## 2024-01-17 DIAGNOSIS — E349 Endocrine disorder, unspecified: Secondary | ICD-10-CM | POA: Diagnosis not present

## 2024-01-17 DIAGNOSIS — E66811 Obesity, class 1: Secondary | ICD-10-CM

## 2024-01-17 DIAGNOSIS — R7989 Other specified abnormal findings of blood chemistry: Secondary | ICD-10-CM

## 2024-01-17 DIAGNOSIS — Z0001 Encounter for general adult medical examination with abnormal findings: Secondary | ICD-10-CM

## 2024-01-17 LAB — CBC WITH DIFFERENTIAL/PLATELET
Basophils Absolute: 0.1 10*3/uL (ref 0.0–0.1)
Basophils Relative: 0.9 % (ref 0.0–3.0)
Eosinophils Absolute: 0.2 10*3/uL (ref 0.0–0.7)
Eosinophils Relative: 3.4 % (ref 0.0–5.0)
HCT: 42.5 % (ref 39.0–52.0)
Hemoglobin: 14.8 g/dL (ref 13.0–17.0)
Lymphocytes Relative: 18.9 % (ref 12.0–46.0)
Lymphs Abs: 1.3 10*3/uL (ref 0.7–4.0)
MCHC: 34.8 g/dL (ref 30.0–36.0)
MCV: 90.8 fL (ref 78.0–100.0)
Monocytes Absolute: 0.6 10*3/uL (ref 0.1–1.0)
Monocytes Relative: 8.5 % (ref 3.0–12.0)
Neutro Abs: 4.7 10*3/uL (ref 1.4–7.7)
Neutrophils Relative %: 68.3 % (ref 43.0–77.0)
Platelets: 362 10*3/uL (ref 150.0–400.0)
RBC: 4.68 Mil/uL (ref 4.22–5.81)
RDW: 13.5 % (ref 11.5–15.5)
WBC: 6.9 10*3/uL (ref 4.0–10.5)

## 2024-01-17 LAB — LIPID PANEL
Cholesterol: 272 mg/dL — ABNORMAL HIGH (ref 0–200)
HDL: 56.6 mg/dL (ref >39.00)
LDL Cholesterol: 193 mg/dL — ABNORMAL HIGH (ref 0–99)
NonHDL: 215.66
Total CHOL/HDL Ratio: 5
Triglycerides: 114 mg/dL (ref 0.0–149.0)
VLDL: 22.8 mg/dL (ref 0.0–40.0)

## 2024-01-17 LAB — COMPREHENSIVE METABOLIC PANEL
ALT: 29 U/L (ref 0–53)
AST: 24 U/L (ref 0–37)
Albumin: 4.3 g/dL (ref 3.5–5.2)
Alkaline Phosphatase: 66 U/L (ref 39–117)
BUN: 12 mg/dL (ref 6–23)
CO2: 27 meq/L (ref 19–32)
Calcium: 9.2 mg/dL (ref 8.4–10.5)
Chloride: 100 meq/L (ref 96–112)
Creatinine, Ser: 1.01 mg/dL (ref 0.40–1.50)
GFR: 94.1 mL/min (ref >60.00)
Glucose, Bld: 84 mg/dL (ref 70–99)
Potassium: 4.1 meq/L (ref 3.5–5.1)
Sodium: 136 meq/L (ref 135–145)
Total Bilirubin: 0.8 mg/dL (ref 0.2–1.2)
Total Protein: 7.6 g/dL (ref 6.0–8.3)

## 2024-01-17 LAB — TESTOSTERONE: Testosterone: 390.61 ng/dL (ref 300.00–890.00)

## 2024-01-18 DIAGNOSIS — L309 Dermatitis, unspecified: Secondary | ICD-10-CM | POA: Insufficient documentation

## 2024-01-18 DIAGNOSIS — E66811 Obesity, class 1: Secondary | ICD-10-CM | POA: Insufficient documentation

## 2024-01-18 DIAGNOSIS — N522 Drug-induced erectile dysfunction: Secondary | ICD-10-CM | POA: Insufficient documentation

## 2024-01-18 DIAGNOSIS — L649 Androgenic alopecia, unspecified: Secondary | ICD-10-CM | POA: Insufficient documentation

## 2024-01-18 NOTE — Assessment & Plan Note (Signed)
On dutasteride, reports that it is working for him, will continue as prescribed, refills sent

## 2024-01-18 NOTE — Assessment & Plan Note (Signed)
Very dry, erythematous patches of skin on his face, seen on physical exam today, will start him on triamcinolone cream, pt will let me know if this works for him, if not then he may need new referral to dermatology, reports he is already failed antifungal creams for this rash.

## 2024-01-18 NOTE — Assessment & Plan Note (Signed)
On zepbound which he is getting online currently, states he would like to try getting it from [facility], is engaging in diet and exercise plan currently.

## 2024-01-18 NOTE — Assessment & Plan Note (Signed)
History of, pt is requesting a new level to be drawn. Orders placed.

## 2024-01-18 NOTE — Assessment & Plan Note (Signed)
Due to the medication for alopecia, will call in cialis for the pt. Risks/benefits of the medication discussed with patient in visit.

## 2024-01-20 ENCOUNTER — Encounter: Payer: Self-pay | Admitting: Family Medicine

## 2024-01-25 DIAGNOSIS — M5414 Radiculopathy, thoracic region: Secondary | ICD-10-CM | POA: Diagnosis not present

## 2024-01-25 DIAGNOSIS — M9902 Segmental and somatic dysfunction of thoracic region: Secondary | ICD-10-CM | POA: Diagnosis not present

## 2024-01-25 DIAGNOSIS — M9903 Segmental and somatic dysfunction of lumbar region: Secondary | ICD-10-CM | POA: Diagnosis not present

## 2024-01-25 DIAGNOSIS — M5386 Other specified dorsopathies, lumbar region: Secondary | ICD-10-CM | POA: Diagnosis not present

## 2024-01-25 DIAGNOSIS — M9904 Segmental and somatic dysfunction of sacral region: Secondary | ICD-10-CM | POA: Diagnosis not present

## 2024-01-27 DIAGNOSIS — M9904 Segmental and somatic dysfunction of sacral region: Secondary | ICD-10-CM | POA: Diagnosis not present

## 2024-01-27 DIAGNOSIS — M5414 Radiculopathy, thoracic region: Secondary | ICD-10-CM | POA: Diagnosis not present

## 2024-01-27 DIAGNOSIS — M5386 Other specified dorsopathies, lumbar region: Secondary | ICD-10-CM | POA: Diagnosis not present

## 2024-01-27 DIAGNOSIS — M9903 Segmental and somatic dysfunction of lumbar region: Secondary | ICD-10-CM | POA: Diagnosis not present

## 2024-04-17 ENCOUNTER — Ambulatory Visit: Admitting: Family Medicine

## 2024-04-17 ENCOUNTER — Encounter: Payer: Self-pay | Admitting: Family Medicine

## 2024-04-17 VITALS — BP 112/70 | HR 85 | Temp 98.8°F | Ht 71.0 in | Wt 195.2 lb

## 2024-04-17 DIAGNOSIS — R5383 Other fatigue: Secondary | ICD-10-CM

## 2024-04-17 DIAGNOSIS — R400 Somnolence: Secondary | ICD-10-CM

## 2024-04-17 DIAGNOSIS — E782 Mixed hyperlipidemia: Secondary | ICD-10-CM | POA: Diagnosis not present

## 2024-04-17 NOTE — Progress Notes (Signed)
 Established Patient Office Visit  Subjective   Patient ID: Peter Blankenship, male    DOB: 28-Apr-1986  Age: 38 y.o. MRN: [id number]  Chief Complaint  Patient presents with   Results    Patient requests repeat testosterone  testing due to low results   Fatigue    [id number]   Sexual Problem    Requests Rx for Enclomiphene     Pt is here for follow up on his cholesterol level and his testosterone  level. States that this has been an issue with him since he was a teenager. States that he actually used to be on statin medication. He reports severe debilitating exhaustion, states that it is a struggle to get through work and then when he comes home he is so tired he has to lay down and not move at all. He states that when he was being treated with testosterone  he "felt great" and had a significant more amount of energy to be able to get through his day. I counseled the patient on the risks/benefits of testosterone  therapy and other possible reasons for his exhaustion including possible OSA vs. Other problems. Pt reports that he was tested for ADHD, states he thinks he might be "on the spectrum" although this was never formally diagnosed. He is currently taking zepbound  weekly for weight loss and it is working well for him. His exhaustion is not related to the zepbound  per his report.     Current Outpatient Medications  Medication Instructions   dutasteride  (AVODART ) 0.5 mg, Oral, Daily   tadalafil  (CIALIS ) 5 mg, Oral, Daily   tirzepatide  (ZEPBOUND ) 10 mg, Subcutaneous, Weekly   triamcinolone  cream (KENALOG ) 0.1 % 1 Application, Topical, 2 times daily, Use for 14 days, then 14 days off    Patient Active Problem List   Diagnosis Date Noted   Mixed hyperlipidemia 04/25/2024   Other fatigue 04/25/2024   Daytime somnolence 04/25/2024   Obesity, Class I, BMI 30-34.9 01/18/2024   Androgenic alopecia 01/18/2024   Drug-induced erectile dysfunction 01/18/2024   Eczema of face 01/18/2024   Flushing  11/15/2018   Low testosterone  11/15/2018   Tachycardia 08/19/2018   SOB (shortness of breath) 08/19/2018      Review of Systems  All other systems reviewed and are negative.     Objective:     BP 112/70   Pulse 85   Temp 98.8 F (37.1 C) (Oral)   Ht 5\' 11"  (1.803 m)   Wt 195 lb 3.2 oz (88.5 kg)   SpO2 100%   BMI 27.22 kg/m    Physical Exam Vitals reviewed.  Constitutional:      Appearance: Normal appearance. He is well-groomed and normal weight.  Eyes:     Extraocular Movements: Extraocular movements intact.     Conjunctiva/sclera: Conjunctivae normal.  Neck:     Thyroid : No thyromegaly.  Cardiovascular:     Rate and Rhythm: Normal rate and regular rhythm.     Heart sounds: S1 normal and S2 normal. No murmur heard. Pulmonary:     Effort: Pulmonary effort is normal.     Breath sounds: Normal breath sounds and air entry. No rales.  Abdominal:     General: Abdomen is flat. Bowel sounds are normal.  Musculoskeletal:     Right lower leg: No edema.     Left lower leg: No edema.  Neurological:     General: No focal deficit present.     Mental Status: He is alert and oriented to person, place, and time.  Gait: Gait is intact.  Psychiatric:        Mood and Affect: Mood and affect normal.      No results found for any visits on 04/17/24.    The ASCVD Risk score (Arnett DK, et al., 2019) failed to calculate for the following reasons:   The 2019 ASCVD risk score is only valid for ages 78 to 16    Assessment & Plan:  Mixed hyperlipidemia Assessment & Plan: History of, will recheck lipid panel since pt is on zepbound  and is doing well losing weight.   Orders: -     Lipid panel; Future  Other fatigue Assessment & Plan: Last testosterone  level was WNL, we discussed that there could be a different reason why he is so exhausted. Will order a more complete hormone panel for further investigation. Pt is requesting a referral to endocrinology as well since we  discussed that if his testing comes back and is normal it would not be in my scope of practice to prescribe testosterone .   Orders: -     Testosterone , Free, Total, SHBG; Future -     Ambulatory referral to Endocrinology -     Alb+Prl+FSH+LH+Prog+DHEA+Es...; Future  Daytime somnolence Assessment & Plan: I believe that OSA needs  to be formally ruled out since other testing in the past and come back normal. I counseled the patient on this and he is agreeable, will order home sleep study  Orders: -     Ambulatory referral to Sleep Studies   30 minutes spent today with patient mostly in counseling and discussion of the exhaustion he is experiencing.   No follow-ups on file.    Aida House, MD

## 2024-04-18 ENCOUNTER — Other Ambulatory Visit (INDEPENDENT_AMBULATORY_CARE_PROVIDER_SITE_OTHER)

## 2024-04-18 DIAGNOSIS — R5383 Other fatigue: Secondary | ICD-10-CM | POA: Diagnosis not present

## 2024-04-18 DIAGNOSIS — R5382 Chronic fatigue, unspecified: Secondary | ICD-10-CM | POA: Diagnosis not present

## 2024-04-18 DIAGNOSIS — E782 Mixed hyperlipidemia: Secondary | ICD-10-CM | POA: Diagnosis not present

## 2024-04-18 DIAGNOSIS — N529 Male erectile dysfunction, unspecified: Secondary | ICD-10-CM | POA: Diagnosis not present

## 2024-04-18 LAB — LIPID PANEL
Cholesterol: 246 mg/dL — ABNORMAL HIGH (ref 0–200)
HDL: 49 mg/dL (ref >39.00)
LDL Cholesterol: 168 mg/dL — ABNORMAL HIGH (ref 0–99)
NonHDL: 196.92
Total CHOL/HDL Ratio: 5
Triglycerides: 144 mg/dL (ref 0.0–149.0)
VLDL: 28.8 mg/dL (ref 0.0–40.0)

## 2024-04-19 ENCOUNTER — Ambulatory Visit: Payer: Self-pay | Admitting: Family Medicine

## 2024-04-21 LAB — ALB+PRL+FSH+LH+PROG+DHEA+ES...
Albumin: 4.9 g/dL (ref 4.1–5.1)
Estrogen: 108 pg/mL (ref 56–213)
FSH: 2.3 m[IU]/mL (ref 1.5–12.4)
LH: 3.7 m[IU]/mL (ref 1.7–8.6)
Progesterone: <0.1 ng/mL (ref 0.0–0.5)
Prolactin: 22.6 ng/mL (ref 3.9–22.7)
Sex Hormone Binding: 40.5 nmol/L (ref 16.5–55.9)
Vit D, 25-Hydroxy: 24.6 ng/mL — ABNORMAL LOW (ref 30.0–100.0)

## 2024-04-21 LAB — TESTOSTERONE, FREE, TOTAL, SHBG
Testosterone, Free: 15.6 pg/mL (ref 8.7–25.1)
Testosterone: 595 ng/dL (ref 264–916)

## 2024-04-25 DIAGNOSIS — E782 Mixed hyperlipidemia: Secondary | ICD-10-CM | POA: Insufficient documentation

## 2024-04-25 DIAGNOSIS — R400 Somnolence: Secondary | ICD-10-CM | POA: Insufficient documentation

## 2024-04-25 DIAGNOSIS — R5383 Other fatigue: Secondary | ICD-10-CM | POA: Insufficient documentation

## 2024-04-25 NOTE — Assessment & Plan Note (Signed)
 History of, will recheck lipid panel since pt is on zepbound  and is doing well losing weight.

## 2024-04-25 NOTE — Assessment & Plan Note (Signed)
 I believe that OSA needs  to be formally ruled out since other testing in the past and come back normal. I counseled the patient on this and he is agreeable, will order home sleep study

## 2024-04-25 NOTE — Assessment & Plan Note (Signed)
 Last testosterone  level was WNL, we discussed that there could be a different reason why he is so exhausted. Will order a more complete hormone panel for further investigation. Pt is requesting a referral to endocrinology as well since we discussed that if his testing comes back and is normal it would not be in my scope of practice to prescribe testosterone .

## 2024-07-06 ENCOUNTER — Telehealth: Payer: Self-pay | Admitting: *Deleted

## 2024-07-06 NOTE — Telephone Encounter (Signed)
 07/06/24-patient informed of the message below.  Peter Blankenship       Message Received: Today Ozell Heron HERO, MD  Christyne Peter LABOR, CMA Please let patient know endo declined his referral due to fatigue not being an endocrine diagnosis.       Previous Messages    ----- Message ----- From: Jerrye Dorn BRAVO Sent: 06/18/2024   5:16 PM EDT To: Heron HERO Ozell, MD  Hey Dr Ozell per dr Dartha, this is not an endocrine diagnosis.

## 2024-08-07 DIAGNOSIS — M5386 Other specified dorsopathies, lumbar region: Secondary | ICD-10-CM | POA: Diagnosis not present

## 2024-08-07 DIAGNOSIS — M9903 Segmental and somatic dysfunction of lumbar region: Secondary | ICD-10-CM | POA: Diagnosis not present

## 2024-08-07 DIAGNOSIS — M62830 Muscle spasm of back: Secondary | ICD-10-CM | POA: Diagnosis not present

## 2024-08-07 DIAGNOSIS — M9904 Segmental and somatic dysfunction of sacral region: Secondary | ICD-10-CM | POA: Diagnosis not present

## 2024-08-07 DIAGNOSIS — M9902 Segmental and somatic dysfunction of thoracic region: Secondary | ICD-10-CM | POA: Diagnosis not present

## 2024-08-10 DIAGNOSIS — M5386 Other specified dorsopathies, lumbar region: Secondary | ICD-10-CM | POA: Diagnosis not present

## 2024-08-10 DIAGNOSIS — M62830 Muscle spasm of back: Secondary | ICD-10-CM | POA: Diagnosis not present

## 2024-08-10 DIAGNOSIS — M9904 Segmental and somatic dysfunction of sacral region: Secondary | ICD-10-CM | POA: Diagnosis not present

## 2024-08-10 DIAGNOSIS — M9903 Segmental and somatic dysfunction of lumbar region: Secondary | ICD-10-CM | POA: Diagnosis not present

## 2024-10-18 ENCOUNTER — Encounter: Payer: Self-pay | Admitting: Family Medicine

## 2024-10-18 ENCOUNTER — Other Ambulatory Visit: Payer: Self-pay | Admitting: Family Medicine

## 2024-10-18 DIAGNOSIS — L649 Androgenic alopecia, unspecified: Secondary | ICD-10-CM

## 2024-10-18 DIAGNOSIS — E66811 Obesity, class 1: Secondary | ICD-10-CM

## 2024-10-18 MED ORDER — DUTASTERIDE 0.5 MG PO CAPS: 0.5000 mg | ORAL_CAPSULE | Freq: Every day | ORAL | 0 refills | Status: AC
